# Patient Record
Sex: Female | Born: 1965
Health system: Southern US, Community
[De-identification: ages and names within clinical notes are randomized; demographics above are authoritative.]

## PROBLEM LIST (undated history)

## (undated) DIAGNOSIS — O021 Missed abortion: Secondary | ICD-10-CM

## (undated) DIAGNOSIS — K219 Gastro-esophageal reflux disease without esophagitis: Secondary | ICD-10-CM

## (undated) DIAGNOSIS — N3010 Interstitial cystitis (chronic) without hematuria: Secondary | ICD-10-CM

## (undated) DIAGNOSIS — M1990 Unspecified osteoarthritis, unspecified site: Secondary | ICD-10-CM

## (undated) HISTORY — PX: TUBAL LIGATION: SHX77

## (undated) HISTORY — PX: DILATION AND CURETTAGE OF UTERUS: SHX78

## (undated) HISTORY — PX: OTHER SURGICAL HISTORY: SHX169

## (undated) HISTORY — PX: NECK SURGERY: SHX720

## (undated) HISTORY — PX: COLONOSCOPY: SHX174

## (undated) HISTORY — PX: DIAGNOSTIC LAPAROSCOPY: SUR761

## (undated) HISTORY — PX: ABDOMINAL HYSTERECTOMY: SHX81

---

## 1998-03-02 ENCOUNTER — Encounter: Admission: RE | Admit: 1998-03-02 | Discharge: 1998-05-31 | Payer: Self-pay | Admitting: *Deleted

## 1998-03-09 ENCOUNTER — Other Ambulatory Visit: Admission: RE | Admit: 1998-03-09 | Discharge: 1998-03-09 | Payer: Self-pay | Admitting: Obstetrics and Gynecology

## 1998-07-04 ENCOUNTER — Inpatient Hospital Stay (HOSPITAL_COMMUNITY): Admission: AD | Admit: 1998-07-04 | Discharge: 1998-07-04 | Payer: Self-pay | Admitting: *Deleted

## 1998-07-10 ENCOUNTER — Inpatient Hospital Stay (HOSPITAL_COMMUNITY): Admission: AD | Admit: 1998-07-10 | Discharge: 1998-07-10 | Payer: Self-pay | Admitting: Obstetrics and Gynecology

## 1998-10-02 ENCOUNTER — Inpatient Hospital Stay (HOSPITAL_COMMUNITY): Admission: AD | Admit: 1998-10-02 | Discharge: 1998-10-04 | Payer: Self-pay | Admitting: Obstetrics and Gynecology

## 1998-11-17 ENCOUNTER — Other Ambulatory Visit: Admission: RE | Admit: 1998-11-17 | Discharge: 1998-11-17 | Payer: Self-pay | Admitting: Obstetrics and Gynecology

## 1999-05-31 ENCOUNTER — Other Ambulatory Visit: Admission: RE | Admit: 1999-05-31 | Discharge: 1999-05-31 | Payer: Self-pay | Admitting: Obstetrics and Gynecology

## 2001-01-07 ENCOUNTER — Encounter: Payer: Self-pay | Admitting: Obstetrics and Gynecology

## 2001-01-07 ENCOUNTER — Encounter: Admission: RE | Admit: 2001-01-07 | Discharge: 2001-01-07 | Payer: Self-pay | Admitting: Obstetrics and Gynecology

## 2001-03-15 ENCOUNTER — Ambulatory Visit (HOSPITAL_COMMUNITY): Admission: RE | Admit: 2001-03-15 | Discharge: 2001-03-15 | Payer: Self-pay | Admitting: Obstetrics and Gynecology

## 2001-03-21 ENCOUNTER — Emergency Department (HOSPITAL_COMMUNITY): Admission: EM | Admit: 2001-03-21 | Discharge: 2001-03-21 | Payer: Self-pay | Admitting: Emergency Medicine

## 2001-03-21 ENCOUNTER — Encounter: Payer: Self-pay | Admitting: Emergency Medicine

## 2001-04-07 ENCOUNTER — Ambulatory Visit (HOSPITAL_COMMUNITY): Admission: RE | Admit: 2001-04-07 | Discharge: 2001-04-07 | Payer: Self-pay | Admitting: *Deleted

## 2001-05-10 ENCOUNTER — Encounter: Payer: Self-pay | Admitting: Internal Medicine

## 2001-05-10 ENCOUNTER — Encounter: Admission: RE | Admit: 2001-05-10 | Discharge: 2001-05-10 | Payer: Self-pay | Admitting: Internal Medicine

## 2001-07-13 ENCOUNTER — Encounter: Admission: RE | Admit: 2001-07-13 | Discharge: 2001-07-13 | Payer: Self-pay | Admitting: Neurosurgery

## 2001-07-13 ENCOUNTER — Encounter: Payer: Self-pay | Admitting: Neurosurgery

## 2001-08-08 ENCOUNTER — Ambulatory Visit (HOSPITAL_COMMUNITY): Admission: RE | Admit: 2001-08-08 | Discharge: 2001-08-09 | Payer: Self-pay | Admitting: Neurosurgery

## 2001-08-08 ENCOUNTER — Encounter: Payer: Self-pay | Admitting: Neurosurgery

## 2001-09-09 ENCOUNTER — Encounter: Payer: Self-pay | Admitting: Neurosurgery

## 2001-09-09 ENCOUNTER — Ambulatory Visit (HOSPITAL_COMMUNITY): Admission: RE | Admit: 2001-09-09 | Discharge: 2001-09-09 | Payer: Self-pay | Admitting: Neurosurgery

## 2001-12-17 ENCOUNTER — Encounter: Admission: RE | Admit: 2001-12-17 | Discharge: 2001-12-17 | Payer: Self-pay | Admitting: Neurosurgery

## 2001-12-17 ENCOUNTER — Encounter: Payer: Self-pay | Admitting: Neurosurgery

## 2002-04-07 ENCOUNTER — Encounter: Admission: RE | Admit: 2002-04-07 | Discharge: 2002-04-07 | Payer: Self-pay | Admitting: Urology

## 2002-04-07 ENCOUNTER — Encounter: Payer: Self-pay | Admitting: Urology

## 2002-04-22 ENCOUNTER — Encounter (INDEPENDENT_AMBULATORY_CARE_PROVIDER_SITE_OTHER): Payer: Self-pay

## 2002-04-22 ENCOUNTER — Ambulatory Visit (HOSPITAL_COMMUNITY): Admission: RE | Admit: 2002-04-22 | Discharge: 2002-04-22 | Payer: Self-pay | Admitting: Obstetrics and Gynecology

## 2002-04-29 ENCOUNTER — Encounter: Payer: Self-pay | Admitting: Gastroenterology

## 2002-04-29 ENCOUNTER — Encounter: Admission: RE | Admit: 2002-04-29 | Discharge: 2002-04-29 | Payer: Self-pay | Admitting: Gastroenterology

## 2003-07-31 HISTORY — PX: OVARIAN CYST REMOVAL: SHX89

## 2004-06-08 ENCOUNTER — Encounter: Admission: RE | Admit: 2004-06-08 | Discharge: 2004-06-08 | Payer: Self-pay | Admitting: Neurosurgery

## 2004-06-16 ENCOUNTER — Encounter: Admission: RE | Admit: 2004-06-16 | Discharge: 2004-06-16 | Payer: Self-pay | Admitting: Neurosurgery

## 2004-10-17 ENCOUNTER — Ambulatory Visit (HOSPITAL_COMMUNITY): Admission: RE | Admit: 2004-10-17 | Discharge: 2004-10-17 | Payer: Self-pay | Admitting: Neurology

## 2004-11-02 ENCOUNTER — Encounter: Admission: RE | Admit: 2004-11-02 | Discharge: 2004-11-02 | Payer: Self-pay | Admitting: Neurology

## 2005-05-14 ENCOUNTER — Ambulatory Visit (HOSPITAL_BASED_OUTPATIENT_CLINIC_OR_DEPARTMENT_OTHER): Admission: RE | Admit: 2005-05-14 | Discharge: 2005-05-14 | Payer: Self-pay | Admitting: Urology

## 2005-05-14 ENCOUNTER — Ambulatory Visit (HOSPITAL_COMMUNITY): Admission: RE | Admit: 2005-05-14 | Discharge: 2005-05-14 | Payer: Self-pay | Admitting: Urology

## 2006-12-10 ENCOUNTER — Encounter: Admission: RE | Admit: 2006-12-10 | Discharge: 2006-12-10 | Payer: Self-pay | Admitting: Neurosurgery

## 2006-12-13 ENCOUNTER — Encounter: Admission: RE | Admit: 2006-12-13 | Discharge: 2006-12-13 | Payer: Self-pay | Admitting: Neurosurgery

## 2007-05-09 ENCOUNTER — Encounter: Admission: RE | Admit: 2007-05-09 | Discharge: 2007-05-09 | Payer: Self-pay | Admitting: Obstetrics and Gynecology

## 2007-10-07 ENCOUNTER — Ambulatory Visit (HOSPITAL_BASED_OUTPATIENT_CLINIC_OR_DEPARTMENT_OTHER): Admission: RE | Admit: 2007-10-07 | Discharge: 2007-10-07 | Payer: Self-pay | Admitting: Urology

## 2007-10-21 ENCOUNTER — Ambulatory Visit (HOSPITAL_BASED_OUTPATIENT_CLINIC_OR_DEPARTMENT_OTHER): Admission: RE | Admit: 2007-10-21 | Discharge: 2007-10-21 | Payer: Self-pay | Admitting: Urology

## 2008-05-10 ENCOUNTER — Encounter: Admission: RE | Admit: 2008-05-10 | Discharge: 2008-05-10 | Payer: Self-pay | Admitting: Obstetrics and Gynecology

## 2008-12-09 ENCOUNTER — Ambulatory Visit (HOSPITAL_COMMUNITY): Admission: RE | Admit: 2008-12-09 | Discharge: 2008-12-09 | Payer: Self-pay | Admitting: Internal Medicine

## 2009-05-11 ENCOUNTER — Encounter: Admission: RE | Admit: 2009-05-11 | Discharge: 2009-05-11 | Payer: Self-pay | Admitting: Obstetrics and Gynecology

## 2009-07-30 HISTORY — PX: OTHER SURGICAL HISTORY: SHX169

## 2010-05-24 ENCOUNTER — Encounter: Admission: RE | Admit: 2010-05-24 | Discharge: 2010-05-24 | Payer: Self-pay | Admitting: Obstetrics and Gynecology

## 2010-09-18 ENCOUNTER — Other Ambulatory Visit: Payer: Self-pay

## 2010-09-18 DIAGNOSIS — R102 Pelvic and perineal pain: Secondary | ICD-10-CM

## 2010-09-20 ENCOUNTER — Ambulatory Visit
Admission: RE | Admit: 2010-09-20 | Discharge: 2010-09-20 | Disposition: A | Discharge status: Home / Self Care | Payer: BC Managed Care – PPO | Source: Ambulatory Visit

## 2010-09-20 DIAGNOSIS — R102 Pelvic and perineal pain: Secondary | ICD-10-CM

## 2010-10-23 ENCOUNTER — Other Ambulatory Visit: Payer: Self-pay

## 2010-10-23 ENCOUNTER — Other Ambulatory Visit: Payer: Self-pay | Admitting: *Deleted

## 2010-10-23 DIAGNOSIS — N83209 Unspecified ovarian cyst, unspecified side: Secondary | ICD-10-CM

## 2010-10-23 DIAGNOSIS — R339 Retention of urine, unspecified: Secondary | ICD-10-CM

## 2010-10-25 ENCOUNTER — Other Ambulatory Visit: Payer: BC Managed Care – PPO

## 2010-10-26 ENCOUNTER — Ambulatory Visit
Admission: RE | Admit: 2010-10-26 | Discharge: 2010-10-26 | Disposition: A | Discharge status: Home / Self Care | Payer: BC Managed Care – PPO | Source: Ambulatory Visit | Attending: *Deleted | Admitting: *Deleted

## 2010-10-26 ENCOUNTER — Ambulatory Visit
Admission: RE | Admit: 2010-10-26 | Discharge: 2010-10-26 | Disposition: A | Discharge status: Home / Self Care | Payer: BC Managed Care – PPO | Source: Ambulatory Visit

## 2010-10-26 DIAGNOSIS — N83209 Unspecified ovarian cyst, unspecified side: Secondary | ICD-10-CM

## 2010-10-26 DIAGNOSIS — R339 Retention of urine, unspecified: Secondary | ICD-10-CM

## 2010-12-12 NOTE — Op Note (Signed)
NAMEMarland Kitchen  Katie Rice, Katie Rice NO.:  [id number]   MEDICAL RECORD NO.:  [id number]          PATIENT TYPE:  AMB   LOCATION:  N[facility]C                         FACILITY:  [facility]   PHYSICIAN:  Jamison Neighbor, M.D.  DATE OF BIRTH:  [birth year]   DATE OF PROCEDURE:  10/21/2007  DATE OF DISCHARGE:                               OPERATIVE REPORT   SERVICE:  Urology.   PREOPERATIVE DIAGNOSIS:  Urgency incontinence.   POSTOPERATIVE DIAGNOSIS:  Urgency incontinence.   PROCEDURE:  Second stage InterStim implantation.   SURGEON:  Dr. Marcelyn Bruins.   ANESTHESIA:  Local plus IV sedation.   COMPLICATIONS:  None.   DRAINS:  None.   BRIEF HISTORY:  This 45 year old female had an outstanding response to  her first stage InterStim implantation.  She has had greater than 50%  decrease in her urgency and frequency episodes and complete control of  any urge incontinence.  The patient is now to undergo second stage  InterStim implantation.  She understands the risks and benefits of the  procedure and in particular is aware of the potential risk for future  revisions of the device or for infection. Full informed consent was  obtained.   PROCEDURE:  After successful induction of adequate IV sedation, the  patient was placed in the prone position, prepped with Betadine and  draped in the usual sterile fashion.  A Vi-Drape was placed over the  operative site.  The local anesthetic was infiltrated into the skin and  the previous incision was opened and then lengthened medially. The  connection between the previously placed quadripolar lead and the  external lead was identified. The protective bootie was cut away and the  external lead was discarded.  The previously placed quadripolar lead was  then attached to a new IPG tube device. A pocket was created and was  irrigated with antibiotic solution.  The IPG was placed down within the  pocket with the writing side up.  Impedance testing was done  and  everything appeared to be working normally.  The incision was then  closed with 2-0 Vicryl and 3-0 nylon sutures.  The patient tolerated the  procedure well and was taken to the recovery room in good condition.  She will return in one week's time for suture removal and reevaluation.     Jamison Neighbor, M.D.  Electronically Signed    RJE/MEDQ  D:  10/21/2007  T:  10/21/2007  Job:  [id number]

## 2010-12-12 NOTE — Op Note (Signed)
NAME:  Katie Rice, Katie Rice NO.:  [id number]   MEDICAL RECORD NO.:  [id number]          PATIENT TYPE:  AMB   LOCATION:  NESC                         FACILITY:  [facility]   PHYSICIAN:  Jamison Neighbor, M.D.  DATE OF BIRTH:  10/01/1965   DATE OF PROCEDURE:  10/07/2007  DATE OF DISCHARGE:                               OPERATIVE REPORT   SERVICE:  Urology.   PREOPERATIVE DIAGNOSES:  Urgency, frequency syndrome with associated  history of sphincter dyssynergia.   SECONDARY DIAGNOSIS:  Intermittent urinary retention.   POSTOPERATIVE DIAGNOSES:  Urgency, frequency syndrome with associated  history of sphincter dyssynergia, intermittent urinary retention.   PROCEDURE:  First stage InterStim implantation.   SURGEON:  Jamison Neighbor, M.D.   ANESTHESIA:  Was local plus IV sedation.   COMPLICATIONS:  None.   DRAINS:  None.   BRIEF HISTORY:  This 45 year old female has had problems with inability  to relax her pelvic floor and associated external sphincter dyssynergia.  The patient has had a difficult time emptying her bladder.  This is due  at least in part to the chronic pain she has associated with  interstitial cystitis.   The patient's voiding diary shows that she does not have an excessive  number of voids per day but has episodes of incomplete emptying.  She is  now to undergo InterStim first stage testing to determine if the  InterStim implant will help with her voiding dysfunction.  She  understands the risks and benefits of the procedure and gave full  informed consent.   PROCEDURE IN DETAIL:  After adequate successful IV sedation the patient  was placed in the prone position.  The buttocks were pulled apart with  tape in order to expose the rectal sphincter.  The entire area was  prepped for 10 minutes with scrub and paint and then covered with Vi-  Drape.  Fluoroscopy was used to identify the level of the third sacral  foramen and a search needle was placed  in the third foramen on each  side.  Stimulation showed dorsiflexion of the great toe on both the  right and left side and a reasonable bellows affect bilaterally but much  better on the left than on the right.  Passing the needle one level  higher showed rotation of the foot indicating that this clearly was the  S3 foramen.  The search needle was then left in place and a guidewire  was passed through it and the needle was removed.  A small incision was  made and the dilating trocar was passed down into the third sacral  foramen.  The quadripolar lead was then passed down and positioned at  appropriate level.  There was excellent stimulation of leads I, II and  III.  A small incision was made in the upper aspect of the left buttock  and the tunneling tool was used to take the quadripolar lead over to  this small opening and at this location it was connected to the lead  extender which was then tunneled out to the right-hand side.  The small  pocket was irrigated and the connecter was then placed down within this  pocket.  The incision was closed with a Vicryl plus 3-0 nylon sutures.  The patient tolerated the procedure and was taken to the recovery room  in good condition.      Jamison Neighbor, M.D.  Electronically Signed     RJE/MEDQ  D:  10/07/2007  T:  10/08/2007  Job:  [id number]

## 2010-12-15 NOTE — Op Note (Signed)
NAME:  Katie Rice, Katie Rice NO.:  [id number]   MEDICAL RECORD NO.:  [id number]          PATIENT TYPE:  AMB   LOCATION:  NESC                         FACILITY:  [facility]   PHYSICIAN:  Valetta Fuller, M.D.  DATE OF BIRTH:  [birth year]   DATE OF PROCEDURE:  DATE OF DISCHARGE:                                 OPERATIVE REPORT   PREOPERATIVE DIAGNOSIS:  Chronic pelvic pain with possible painful bladder  syndrome/interstitial cystitis.   POSTOPERATIVE DIAGNOSIS:  Chronic pelvic pain with possible painful bladder  syndrome/interstitial cystitis.   PROCEDURE PERFORMED:  Cystoscopy, hydraulic over distention of the bladder  with instillation.   SURGEON:  Valetta Fuller, M.D.   ANESTHESIA:  General.   INDICATIONS:  [name] is a 45 year old female. She has had some recurrent  urinary tract infections in the past. She has had problems more recently  with some chronic pelvic discomfort. The patient has had evaluation and  treatment for ovarian cystic disease in the past. More recently she has had  progressive pelvic complaints. She has not had a lot of problems with  frequency and nocturia although that has increased somewhat recently. She  does have a constant pressure sensation in her pelvis with what appears to  be intermittent bladder spasms. She also has painful intercourse and has had  a tender bladder base on clinical exam. She presents now for cystoscopic  evaluation of her bladder to further assess for the possibility of painful  bladder syndrome for both diagnostic and therapeutic purposes.   TECHNIQUE AND FINDINGS:  The patient was brought to the operating room where  she had successful induction of general anesthesia. She was placed in  lithotomy position, prepped and draped in the usual manner. Initial  cystoscopy was totally unremarkable with a normal-appearing bladder. The  patient underwent hydraulic over distention to 100 cmH2O pressure for 5  minutes.  Capacity was extremely normal at about 1500 mL capacity. The  patient did have some mild diffuse glomerular hemorrhaging but it was not  terribly impressive and I would grade this as 1:2. A repeat hydraulic over  distention for 2 minutes was performed. We then instilled some Marcaine in  her bladder. She is brought to the recovery room in stable condition.           ______________________________  Valetta Fuller, M.D.     DSG/MEDQ  D:  05/14/2005  T:  05/14/2005  Job:  [id number]

## 2010-12-15 NOTE — Op Note (Signed)
E[place]    [facility] [place]                                  MRN:  [id number]           Operative Report                                  ATT:  Lenoard Aden, M.D.  Procedure:  03/15/01             DICT: PREOPERATIVE DIAGNOSIS:       Desire for elective sterilization.  POSTOPERATIVE DIAGNOSIS:      Desire for elective sterilization.  PROCEDURE:                    Laparoscopic tubal sterilization.  SURGEON:                      Lenoard Aden, M.D.  ASSISTANT:                    Gretta Cool., M.D.  ANESTHESIA:                   General.  ESTIMATED BLOOD LOSS:         Less than 50 cc.  COMPLICATIONS:                None.  DRAINS:                       None.  COUNTS:                       Correct.  CONDITION:                    The patient recovered in good condition.  FINDINGS:                     Normal liver, gallbladder, normal appendiceal                               area, normal tubes and ovaries, normal sized                               uterus, no anterior/posterior cul-de-sac.  BRIEF OPERATIVE NOTE:         After being apprised of the risks of anesthesia, infection, bleeding, uterine perforation, injury to abdominal organs, the need for repair, the patient went to the operating room where she was administered general anesthesia without complications, prepped and draped in the usual sterile fashion and catheterized until the bladder was empty.  After achieving adequate anesthesia, examined under anesthesia revealed a moderate sized uterus and no adnexal masses.  At this time, Hulka tenaculum placed per vagina.  Infraumbilical incision made with a scalpel after placement of a diluted Marcaine solution.  A Veress needle was placed with an opening pressure -2 noted. The patients pressure set to 25/5 L CO2 insufflated without difficulty.  Trocar placement 10 mm placed atraumatically and pictures taken  -- pictures of normal liver, gallbladder, appendiceal area taken, otherwise, normal structures as previously noted.  Kleppinger bipolar  cautery then entered through the trocar through the operative scope after the pressure has been reset to 15.  At this time, the right tube is traced out to the fimbriated end, grasped and elevate the isthmic section of the tube and burned down using bipolar cautery down to a resistance of 0 in three contiguous segments.  The same procedure was done after tracing out the left tube on the left tube.  Both tubes are divided using hook scissors, good separation is noted. Both tubal lumens are visualized.  At this time, good hemostasis is noted.  CO2 is released, upon releasing CO2, good hemostasis was confirmed, trocar and scope were removed under direct visualization.  Incision is closed using a 0 and a 4-0 Vicryl.  Instruments removed from the vagina. The patient tolerated the procedure well, was transferred to the recovery room in good condition. DD:  03/15/01 TD:  03/16/01 Job: [id number]

## 2010-12-15 NOTE — Op Note (Signed)
[place]  Patient:    Katie Rice, KINDEL Visit Number: [id number] MRN: [id number]          Service Type: Attending:  Garlon Hatchet., M.D. Dictated by:   Garlon Hatchet., M.D. Proc. Date: 08/08/01                             Operative Report  PREOPERATIVE DIAGNOSIS:  Cervical spondylosis and ruptured disk at cervical 5-6 with bilateral C6 radiculopathy.  POSTOPERATIVE DIAGNOSIS:   OPERATION PERFORMED:   Anterior cervical diskectomy and fusion at cervical 5-6 with patellar wedge, 6 mm, and Zephyr plate.  SURGEON:  Garlon Hatchet., M.D.  ASSISTANT:  Reinaldo Meeker, M.D.  ANESTHESIA:  General endotracheal.  HISTORY OF PRESENT ILLNESS:  Patient is a very pleasant 45 year old female who has had longstanding neck and bilateral arm pain radiating down the C6 distribution to her thumb and forefinger.  She had failed conservative treatment with time, anti-inflammatories and physical therapy.  MRI and repeat MRI showed central disk rupture compressing on the spinal cord and both C6 nerve roots.  Patient was sent for counselling who recommended the risks and benefits of anterior cervical diskectomy and fusion; and, she decided to proceed forward.  DESCRIPTION OF PROCEDURE:  Patient was brought to the OR and induced with general anesthesia.  Patient was supine, neck flexed and extended with five pounds of Halter traction.  The right side of her neck was prepped and draped in the usual sterile fashion.  Preoperative x-ray localized the needle over the C5-6 disk space.  A curvilinear incision was made just off the midline to the anterior b border of the sternocleidomastoid.  At this level a superficial  level of the platysma was dissected out and divided longitudinally.  The avascular plane between the sternocleidomastoid and the omohyoid was brought down to the prevertebral fascia.  Prevertebral fascia was dissected with the Kitners.  Intraoperative  x-rays confirmed localization at the C5-6 disk space.  The _________  with an 11 blade scalpel and the remainder of the longus colli was reflected laterally.  A self-retaining retractor was placed.  Using  2 and 3 mm Kerrison punches there was a small anterior osteophyte coming off the C5 vertebral body.  This was removed and the _____ of the ventral annulus and nucleus pulposus was drilled down with a high-speed drill to the posterior longitudinal ligament.  Operating microscope was draped and brought onto the field.  Under microscopic illumination the _____________ was removed in piecemeal fashion with 1 and 2 mm Kerrison punches.  The thecal sac was visualized.  There was a small central disk ruptured that was removed that was compressing on the thecal sac, and both proximal ends of the C6 nerve root were decompressed out their foramen.  At the end of the diskectomy the entire thecal sac and both C6 nerve roots were well decompressed and scored with an angled nerve hook.  The endplates were then prepared for arthrodesis.  Patellar wedge was inserted after the interbody strut has been placed.  Then a Zephyr plate was sized, selected and four 13 mm screws were inserted, set screwed and tightened.  Postop x-ray confirmed good localization of the bone graft, plate and screws.  The wound was copiously irrigated.  Meticulous hemostasis was maintained.  Platysma was closed with 3-0 interrupted Vicryl and the skin was running 4-0 subcuticular.  Benzoin and Steri-Strips were applied.  Patient  was taken to the recovery room in stable condition.  No complications occurred. Dictated by:   Garlon Hatchet., M.D. Attending:  Donalee Citrin, Montez Hageman., M.D. DD:  08/08/01 TD:  08/09/01 Job: [id number]

## 2010-12-15 NOTE — Op Note (Signed)
NAME:  Katie Rice, Katie Rice                          ACCOUNT NO.:  [id number]   MEDICAL RECORD NO.:  [id number]                   PATIENT TYPE:  AMB   LOCATION:  SDC                                  FACILITY:  [facility]   PHYSICIAN:  Lenoard Aden, M.D.             DATE OF BIRTH:  11-29-1965   DATE OF PROCEDURE:  04/22/2002  DATE OF DISCHARGE:                                 OPERATIVE REPORT   CHIEF COMPLAINT:  1. Subacute pelvic pain.  2. Right ovarian cyst.   POSTOPERATIVE DIAGNOSES:  1. Subacute pelvic pain.  2. Right ovarian cyst.   PROCEDURE:  1. Diagnostic laparoscopy.  2. Right ovarian cystectomy.   SURGEON:  Lenoard Aden, M.D.   ANESTHESIA:  General.   ESTIMATED BLOOD LOSS:  Less than 50 cc.   COMPLICATIONS:  None.   COUNTS:  Correct.   SPECIMENS:  Right ovarian cyst wall.   DISPOSITION:  The patient to recovery in good condition.   BRIEF OPERATIVE NOTE:  After being apprised of the risks of anesthesia,  infection, bleeding, injury to abdominal organs with need for repair,  delayed versus immediate complications to include bowel and bladder injury,  inability to cure pelvic pain, the patient is brought to the operating room  where she is administered a general anesthetic without complications,  prepped and draped in the usual sterile fashion, catheterized until the  bladder is emptied.  Examination under anesthesia reveals a mid positioned  uterus and no adnexal masses.  Hulka tenaculum placed per vagina.  Infraumbilical incision made with the scalpel after placement of a dilute  Marcaine solution 5 cc.  After achieving infiltration with Marcaine, scalpel  incision/stab incision is made.  Veress needle placed.  Opening pressure -2  made.  The patient pressure set to 25.  Then 4 L CO2 insufflated without  difficulty.  The 10 mm trocar placed atraumatically.  Pictures taken.  Please note that prior to placement of the trocar hanging drop test was  noted to be  negative upon placement of the Veress needle.  At this time  after atraumatic trocar entry visualization reveals normal liver,  gallbladder area.  Appendix is not visualized.  No evidence of adhesions.  Bilateral tubal interruption noted.  Normal sized uterus and a small left  ovary and an enlarged right ovary consistent with previously noted ovarian  cyst.  At this time two 5 mm ports are made suprapubically under direct  visualization after making incision with the scalpel and 5 mm trocar sites  are placed.  Atraumatic grasper is used to grasp the ovarian ligament.  Ovary is rotated and area of cyst wall is cauterized with Kleppinger bipolar  cautery and opened.  Serosanguineous type material noted from the  hemorrhagic cyst cavity.  The cavity is then grasped using an atraumatic  grasper and teased from the ovarian cyst wall, is removed intact and sent to  pathology.  The base of the ovarian cyst wall bed is then cauterized using  Kleppinger bipolar cautery and cauterizing the edges of the cyst wall as  well.  Irrigation is accomplished.  60 cc placed and aspirated from the cul-  de-sac.  Good hemostasis was noted.  Right ovarian tubo-ovarian round  ligament appears within normal limits.  Both tubes are previously noted had  been surgically divided.  At this time 5 mm trocar is removed under direct  visualization.  CO2 is released.  Incision is closed using 0 Vicryl and  Dermabond.  The patient is awakened and transferred to recovery in good  condition after removal of Hulka tenaculum from vagina.                                               Lenoard Aden, M.D.    RJT/MEDQ  D:  04/22/2002  T:  04/22/2002  Job:  [id number]   cc:   [facility]

## 2010-12-15 NOTE — H&P (Signed)
[facility]  Patient:    Katie Rice, Katie Rice                         MRN: [id number] Adm. Date:  03/15/01 Attending:  Lenoard Aden, M.D.                         History and Physical  CHIEF COMPLAINT:              Desire for elective sterilization.  HISTORY OF PRESENT ILLNESS:   The patient is a 45 year old white female, G6, P3, status post vaginal delivery in 2000, who presents for elective tubal sterilization.  PAST MEDICAL HISTORY:         1. Vaginal delivery x 3.                               2. Spontaneous miscarriage x 3.                               3. LEEP x 1.                               4. History of spinal and neck injury without                                  surgery.                               5. History of sinus surgery in 1994.                               6. Colonoscopy in 1995.  FAMILY HISTORY:               Hypothyroidism, cerebrovascular disease, and hypertension.  ALLERGIES:                    DOXYCYCLINE.  LABORATORY DATA:              Blood type A positive.  MEDICATIONS:                  None.  PHYSICAL EXAMINATION:  GENERAL:                      Well-developed, well-nourished white female in no apparent distress.  HEENT:                        Normal.  LUNGS:                        Clear.  HEART:                        Regular rate and rhythm.  ABDOMEN:                      Soft, nontender.  PELVIC:                       Small  anteflexed uterus.  No adnexal mass.  IMPRESSION:                   Desire for elective sterilization.  PLAN:                         Proceed with elective tubal sterilization.  The risks of anesthesia, infection, bleeding, injury to abdominal organs with need for repair was discussed.  Delayed versus immediate complications to include bowel or bladder injury were noted, with need for repair.  Failure risk of tubal ligation of 5-10 per 1000 noted.  The patient acknowledges and  desires to proceed. DD:  03/15/01 TD:  03/15/01 Job: [id number]

## 2010-12-15 NOTE — H&P (Signed)
   NAME:  Katie Rice, Katie Rice                          ACCOUNT NO.:  [id number]   MEDICAL RECORD NO.:  [id number]                   PATIENT TYPE:  AMB   LOCATION:  [facility]C                                  FACILITY:  [facility]   PHYSICIAN:  Lenoard Aden, M.D.             DATE OF BIRTH:  [birth year]   DATE OF ADMISSION:  04/22/2002  DATE OF DISCHARGE:                                HISTORY & PHYSICAL   CHIEF COMPLAINT:  Left lower quadrant pain.   HISTORY OF PRESENT ILLNESS:  The patient is a 45 year old white female, G6,  P3 status post tubal ligation in 2002 who presents with a subacute history  less than one month of left lower quadrant pain.  She has been seen by  urology with a negative work-up.  Due to gastrointestinal symptoms she  underwent colonoscopy which was negative.  She has a known small 3 cm  hemorrhagic cyst on her right ovary which is the only positive finding with  pain requiring Vicodin for discomfort.  She presents now for definitive  evaluation.   PAST MEDICAL HISTORY:  1. Remarkable for colonoscopy.  2  Vaginal delivery x3.  1. Spontaneous miscarriage x3.  2. Loop electrosurgical excision procedure x1.  3. History of spinal and neck injury without surgery.  4. History of sinus surgery in 1994.   FAMILY HISTORY:  Hyperthyroidism, cerebrovascular disease, hypertension.   ALLERGIES:  DOXYCYCLINE.   BLOOD TYPE:  A positive.   MEDICATIONS:  None.   PAST SURGICAL HISTORY:  Past surgical includes postpartum bilateral tubal  ligation.   PHYSICAL EXAMINATION:  GENERAL:  She is a well-developed, well-nourished  white female in no apparent distress.  HEENT:  Normal.  LUNGS:  Clear.  CARDIOVASCULAR:  Regular rate and rhythm.  ABDOMEN:  Soft. No rebound or guarding.  PELVIC:  Exam reveals a small anteflexed uterus.  No adnexal masses.  EXTREMITIES:  No cords.  NEUROLOGICAL:  Nonfocal.   IMPRESSION:  1. Pelvic pain of unknown etiology.  2. Negative GI and GU  workup.   PLAN:  Proceed with diagnostic laparoscopy, possible ovarian cystectomy.  The risks of anesthesia, infection, bleeding, injury to abdominal organs,  need for repair, delayed or immediate complications with need for repair  discussed, inability to cure pain explained as well.  The patient  acknowledges and wishes to proceed.                                               Lenoard Aden, M.D.    RJT/MEDQ  D:  04/22/2002  T:  04/22/2002  Job:  [id number]

## 2011-04-17 ENCOUNTER — Other Ambulatory Visit: Payer: Self-pay | Admitting: Obstetrics and Gynecology

## 2011-04-17 DIAGNOSIS — Z1231 Encounter for screening mammogram for malignant neoplasm of breast: Secondary | ICD-10-CM

## 2011-04-23 LAB — POCT HEMOGLOBIN-HEMACUE: Operator id: 268271

## 2011-05-24 ENCOUNTER — Other Ambulatory Visit: Payer: Self-pay | Admitting: Gastroenterology

## 2011-05-24 ENCOUNTER — Ambulatory Visit
Admission: RE | Admit: 2011-05-24 | Discharge: 2011-05-24 | Disposition: A | Discharge status: Home / Self Care | Payer: BC Managed Care – PPO | Source: Ambulatory Visit | Attending: Gastroenterology | Admitting: Gastroenterology

## 2011-05-24 DIAGNOSIS — R1013 Epigastric pain: Secondary | ICD-10-CM

## 2011-05-29 ENCOUNTER — Ambulatory Visit
Admission: RE | Admit: 2011-05-29 | Discharge: 2011-05-29 | Disposition: A | Discharge status: Home / Self Care | Payer: BC Managed Care – PPO | Source: Ambulatory Visit | Attending: Obstetrics and Gynecology | Admitting: Obstetrics and Gynecology

## 2011-05-29 DIAGNOSIS — Z1231 Encounter for screening mammogram for malignant neoplasm of breast: Secondary | ICD-10-CM

## 2011-06-19 ENCOUNTER — Other Ambulatory Visit: Payer: Self-pay | Admitting: Gastroenterology

## 2011-06-28 ENCOUNTER — Other Ambulatory Visit (HOSPITAL_COMMUNITY): Payer: Self-pay | Admitting: Gastroenterology

## 2011-06-28 DIAGNOSIS — R1013 Epigastric pain: Secondary | ICD-10-CM

## 2011-07-09 ENCOUNTER — Ambulatory Visit (HOSPITAL_COMMUNITY)
Admission: RE | Admit: 2011-07-09 | Discharge: 2011-07-09 | Disposition: A | Discharge status: Home / Self Care | Payer: BC Managed Care – PPO | Source: Ambulatory Visit | Attending: Gastroenterology | Admitting: Gastroenterology

## 2011-07-09 DIAGNOSIS — R1013 Epigastric pain: Secondary | ICD-10-CM | POA: Insufficient documentation

## 2011-07-09 MED ORDER — TECHNETIUM TC 99M SULFUR COLLOID
2.0000 | Freq: Once | INTRAVENOUS | Status: AC | PRN
  Administered 2011-07-09: 2 via ORAL

## 2012-03-30 HISTORY — PX: OTHER SURGICAL HISTORY: SHX169

## 2012-04-08 ENCOUNTER — Other Ambulatory Visit: Payer: Self-pay | Admitting: Neurosurgery

## 2012-04-08 DIAGNOSIS — M545 Low back pain: Secondary | ICD-10-CM

## 2012-04-09 ENCOUNTER — Ambulatory Visit
Admission: RE | Admit: 2012-04-09 | Discharge: 2012-04-09 | Disposition: A | Discharge status: Home / Self Care | Payer: BC Managed Care – PPO | Source: Ambulatory Visit | Attending: Neurosurgery | Admitting: Neurosurgery

## 2012-04-09 DIAGNOSIS — M545 Low back pain: Secondary | ICD-10-CM

## 2012-04-10 ENCOUNTER — Other Ambulatory Visit: Payer: BC Managed Care – PPO

## 2012-04-21 ENCOUNTER — Other Ambulatory Visit: Payer: Self-pay | Admitting: Obstetrics and Gynecology

## 2012-04-21 DIAGNOSIS — Z1231 Encounter for screening mammogram for malignant neoplasm of breast: Secondary | ICD-10-CM

## 2012-04-28 ENCOUNTER — Encounter (HOSPITAL_COMMUNITY): Payer: Self-pay | Admitting: Pharmacist

## 2012-05-06 ENCOUNTER — Encounter (HOSPITAL_COMMUNITY)
Admission: RE | Admit: 2012-05-06 | Discharge: 2012-05-06 | Disposition: A | Discharge status: Home / Self Care | Payer: BC Managed Care – PPO | Source: Ambulatory Visit | Attending: Obstetrics and Gynecology | Admitting: Obstetrics and Gynecology

## 2012-05-06 ENCOUNTER — Encounter (HOSPITAL_COMMUNITY): Payer: Self-pay

## 2012-05-06 ENCOUNTER — Other Ambulatory Visit: Payer: Self-pay | Admitting: Obstetrics and Gynecology

## 2012-05-06 HISTORY — DX: Missed abortion: O02.1

## 2012-05-06 LAB — CBC
HCT: 40.6 % (ref 36.0–46.0)
Hemoglobin: 13.6 g/dL (ref 12.0–15.0)
MCH: 31.9 pg (ref 26.0–34.0)
RBC: 4.27 MIL/uL (ref 3.87–5.11)

## 2012-05-06 LAB — SURGICAL PCR SCREEN: MRSA, PCR: NEGATIVE

## 2012-05-06 NOTE — Patient Instructions (Addendum)
   Your procedure is scheduled on: Thursday, Oct 17  Enter through the [employer] of [facility] at: 6 am Pick up the phone at the desk and dial [phone] and inform us of your arrival.  Please call this number if you have any problems the morning of surgery: [phone]  Remember: Do not eat food after midnight: Wednesday Do not drink clear liquids after: Wednesday Take these medicines the morning of surgery with a SIP OF WATER: None  Do not wear jewelry, make-up, or FINGER nail polish No metal in your hair or on your body. Do not wear lotions, powders, perfumes. You may wear deodorant.  Please use your CHG wash as directed prior to surgery.  Do not shave anywhere for at least 12 hours prior to first CHG shower.  Do not bring valuables to the hospital. Contacts, dentures or bridgework may not be worn into surgery.  Leave suitcase in the car. After Surgery it may be brought to your room. For patients being admitted to the hospital, checkout time is 11:00am the day of discharge. Home with husband Madelina Sanda cell [phone].  Patients discharged on the day of surgery will not be allowed to drive home.

## 2012-05-14 NOTE — H&P (Signed)
NAMEVALISHA, HESLIN NO.:  [id number]  MEDICAL RECORD NO.:  [id number]  LOCATION:  PERIO                         FACILITY:  [facility]  PHYSICIAN:  Lenoard Aden, M.D.DATE OF BIRTH:  1966/06/16  DATE OF ADMISSION:  04/22/2012 DATE OF DISCHARGE:                             HISTORY & PHYSICAL   CHIEF COMPLAINT:  Pelvic pain, dysmenorrhea, low back pain with negative orthopedic workup, negative neurology work, neurosurgery workup.  She is a 46 year old white female G6, P3 who is status post laparoscopy in 2003 with a history of interstitial cystitis, low back pain, dysmenorrhea, menorrhagia.  She has had a thorough neurologic and orthopedic and neurosurgical workup which does not explained or account any relief for her cyclical dysmenorrhea, low back pain.  She now presents for definitive therapy as recommended per other specialists.  PAST SURGICAL HISTORY:  Remarkable for laparoscopy in 2003, sinus surgery in 1994, tubal ligation, LEEP, and 3 vaginal deliveries.  MEDICATIONS:  Vicodin p.r.n., birth control pills, Benadryl p.r.n., Biotin, vitamin D, B and Elmiron as needed.  FAMILY HISTORY:  Gallbladder disease, diabetes, heart disease, stroke, leukemia, ovarian cancer, hypertension, thyroid disease.  PHYSICAL EXAMINATION:  GENERAL:  She is a well-developed, well- nourished, white female, in no acute distress. HEENT:  Normal. NECK:  Supple.  Full range of motion. LUNGS:  Clear. HEART:  Regular rhythm. ABDOMEN:  Soft, nontender. PELVIC:  Reveals a retroflexed uterus.  No adnexal masses. EXTREMITIES:  There are no cords. NEUROLOGIC:  Nonfocal. SKIN:  Intact.  IMPRESSION:  Persistent cyclical low back pain with dysmenorrhea, menorrhagia, negative workup.  PLAN:  To proceed with a da Vinci-assisted total laparoscopic hysterectomy, bilateral salpingectomy.  Risks of anesthesia, infection, bleeding, injury to abdominal organs, need for repair was  discussed, delayed versus immediate complications to include bowel and bladder injury noted.  The patient acknowledges and wishes to proceed.  Please note it is told that affirmative cure for her pain cannot be promised to be a route of hysterectomy.  She acknowledges to this and wishes to proceed.     Lenoard Aden, M.D.     RJT/MEDQ  D:  05/14/2012  T:  05/14/2012  Job:  [id number]

## 2012-05-15 ENCOUNTER — Encounter (HOSPITAL_COMMUNITY): Payer: Self-pay | Admitting: Anesthesiology

## 2012-05-15 ENCOUNTER — Ambulatory Visit (HOSPITAL_COMMUNITY)
Admission: RE | Admit: 2012-05-15 | Discharge: 2012-05-15 | Disposition: A | Discharge status: Home / Self Care | Payer: BC Managed Care – PPO | Source: Ambulatory Visit | Attending: Obstetrics and Gynecology | Admitting: Obstetrics and Gynecology

## 2012-05-15 ENCOUNTER — Encounter (HOSPITAL_COMMUNITY)
Admission: RE | Disposition: A | Discharge status: Home / Self Care | Payer: Self-pay | Source: Ambulatory Visit | Attending: Obstetrics and Gynecology

## 2012-05-15 ENCOUNTER — Ambulatory Visit (HOSPITAL_COMMUNITY): Payer: BC Managed Care – PPO | Admitting: Anesthesiology

## 2012-05-15 DIAGNOSIS — M545 Low back pain, unspecified: Secondary | ICD-10-CM | POA: Insufficient documentation

## 2012-05-15 DIAGNOSIS — R1032 Left lower quadrant pain: Secondary | ICD-10-CM | POA: Insufficient documentation

## 2012-05-15 DIAGNOSIS — N815 Vaginal enterocele: Secondary | ICD-10-CM | POA: Insufficient documentation

## 2012-05-15 DIAGNOSIS — N949 Unspecified condition associated with female genital organs and menstrual cycle: Secondary | ICD-10-CM | POA: Insufficient documentation

## 2012-05-15 DIAGNOSIS — N946 Dysmenorrhea, unspecified: Secondary | ICD-10-CM | POA: Insufficient documentation

## 2012-05-15 LAB — BASIC METABOLIC PANEL
BUN: 10 mg/dL (ref 6–23)
CO2: 27 mEq/L (ref 19–32)
Calcium: 9.1 mg/dL (ref 8.4–10.5)
Glucose, Bld: 85 mg/dL (ref 70–99)
Potassium: 3.7 mEq/L (ref 3.5–5.1)
Sodium: 139 mEq/L (ref 135–145)

## 2012-05-15 SURGERY — ROBOTIC ASSISTED TOTAL HYSTERECTOMY
Anesthesia: General | Site: Abdomen | Wound class: Clean Contaminated

## 2012-05-15 MED ORDER — GLYCOPYRROLATE 0.2 MG/ML IJ SOLN
INTRAMUSCULAR | Status: AC
Start: 2012-05-15 — End: 2012-05-15
  Filled 2012-05-15: qty 1

## 2012-05-15 MED ORDER — FENTANYL CITRATE 0.05 MG/ML IJ SOLN
INTRAMUSCULAR | Status: DC | PRN
  Administered 2012-05-15: 50 ug via INTRAVENOUS
  Administered 2012-05-15 (×2): 100 ug via INTRAVENOUS
  Administered 2012-05-15 (×2): 50 ug via INTRAVENOUS

## 2012-05-15 MED ORDER — DIPHENHYDRAMINE HCL 12.5 MG/5ML PO ELIX
12.5000 mg | ORAL_SOLUTION | Freq: Four times a day (QID) | ORAL | Status: DC | PRN
  Filled 2012-05-15: qty 5

## 2012-05-15 MED ORDER — HYDROMORPHONE HCL PF 1 MG/ML IJ SOLN
INTRAMUSCULAR | Status: AC
  Filled 2012-05-15: qty 1

## 2012-05-15 MED ORDER — ZOLPIDEM TARTRATE 5 MG PO TABS: 5.0000 mg | ORAL_TABLET | Freq: Every evening | ORAL | Status: DC | PRN

## 2012-05-15 MED ORDER — GLYCOPYRROLATE 0.2 MG/ML IJ SOLN
INTRAMUSCULAR | Status: AC
  Filled 2012-05-15: qty 4

## 2012-05-15 MED ORDER — OXYCODONE-ACETAMINOPHEN 5-325 MG PO TABS: 1.0000 | ORAL_TABLET | ORAL | Status: DC | PRN

## 2012-05-15 MED ORDER — PROPOFOL 10 MG/ML IV EMUL
INTRAVENOUS | Status: AC
  Filled 2012-05-15: qty 20

## 2012-05-15 MED ORDER — ACETAMINOPHEN 10 MG/ML IV SOLN
INTRAVENOUS | Status: AC
  Filled 2012-05-15: qty 100

## 2012-05-15 MED ORDER — NALOXONE HCL 0.4 MG/ML IJ SOLN: 0.4000 mg | INTRAMUSCULAR | Status: DC | PRN

## 2012-05-15 MED ORDER — LACTATED RINGERS IV SOLN
INTRAVENOUS | Status: DC
  Administered 2012-05-15 (×2): via INTRAVENOUS

## 2012-05-15 MED ORDER — MEPERIDINE HCL 25 MG/ML IJ SOLN
12.5000 mg | Freq: Once | INTRAMUSCULAR | Status: AC
Start: 2012-05-15 — End: 2012-05-15
  Administered 2012-05-15: 12.5 mg via INTRAVENOUS

## 2012-05-15 MED ORDER — EPHEDRINE SULFATE 50 MG/ML IJ SOLN
INTRAMUSCULAR | Status: DC | PRN
  Administered 2012-05-15: 10 mg via INTRAVENOUS

## 2012-05-15 MED ORDER — ARTIFICIAL TEARS OP OINT
TOPICAL_OINTMENT | OPHTHALMIC | Status: DC | PRN
  Administered 2012-05-15: 1 via OPHTHALMIC

## 2012-05-15 MED ORDER — FENTANYL CITRATE 0.05 MG/ML IJ SOLN
INTRAMUSCULAR | Status: AC
Start: 2012-05-15 — End: 2012-05-15
  Filled 2012-05-15: qty 5

## 2012-05-15 MED ORDER — SODIUM CHLORIDE 0.9 % IJ SOLN: 9.0000 mL | INTRAMUSCULAR | Status: DC | PRN

## 2012-05-15 MED ORDER — LIDOCAINE HCL (CARDIAC) 20 MG/ML IV SOLN
INTRAVENOUS | Status: AC
Start: 2012-05-15 — End: 2012-05-15
  Filled 2012-05-15: qty 5

## 2012-05-15 MED ORDER — ONDANSETRON HCL 4 MG/2ML IJ SOLN
INTRAMUSCULAR | Status: DC | PRN
  Administered 2012-05-15: 4 mg via INTRAVENOUS

## 2012-05-15 MED ORDER — ROCURONIUM BROMIDE 50 MG/5ML IV SOLN
INTRAVENOUS | Status: AC
  Filled 2012-05-15: qty 2

## 2012-05-15 MED ORDER — GLYCOPYRROLATE 0.2 MG/ML IJ SOLN
INTRAMUSCULAR | Status: DC | PRN
  Administered 2012-05-15: 0.2 mg via INTRAVENOUS
  Administered 2012-05-15: 0.1 mg via INTRAVENOUS
  Administered 2012-05-15: 0.6 mg via INTRAVENOUS

## 2012-05-15 MED ORDER — NEOSTIGMINE METHYLSULFATE 1 MG/ML IJ SOLN
INTRAMUSCULAR | Status: DC | PRN
  Administered 2012-05-15: 4 mg via INTRAVENOUS

## 2012-05-15 MED ORDER — DEXAMETHASONE SODIUM PHOSPHATE 10 MG/ML IJ SOLN
INTRAMUSCULAR | Status: DC | PRN
Start: 2012-05-15 — End: 2012-05-15
  Administered 2012-05-15: 10 mg via INTRAVENOUS

## 2012-05-15 MED ORDER — FENTANYL CITRATE 0.05 MG/ML IJ SOLN
INTRAMUSCULAR | Status: AC
  Administered 2012-05-15: 25 ug via INTRAVENOUS
  Filled 2012-05-15: qty 2

## 2012-05-15 MED ORDER — HYDROMORPHONE 0.3 MG/ML IV SOLN
INTRAVENOUS | Status: DC
  Administered 2012-05-15: 12:00:00 via INTRAVENOUS
  Filled 2012-05-15: qty 25

## 2012-05-15 MED ORDER — TRAMADOL HCL 50 MG PO TABS: 50.0000 mg | ORAL_TABLET | Freq: Four times a day (QID) | ORAL | Status: DC | PRN

## 2012-05-15 MED ORDER — ONDANSETRON HCL 4 MG/2ML IJ SOLN: 4.0000 mg | Freq: Four times a day (QID) | INTRAMUSCULAR | Status: DC | PRN

## 2012-05-15 MED ORDER — PROPOFOL 10 MG/ML IV EMUL
INTRAVENOUS | Status: DC | PRN
  Administered 2012-05-15: 200 mg via INTRAVENOUS

## 2012-05-15 MED ORDER — ROCURONIUM BROMIDE 100 MG/10ML IV SOLN
INTRAVENOUS | Status: DC | PRN
  Administered 2012-05-15: 40 mg via INTRAVENOUS
  Administered 2012-05-15: 20 mg via INTRAVENOUS
  Administered 2012-05-15: 10 mg via INTRAVENOUS

## 2012-05-15 MED ORDER — ONDANSETRON HCL 4 MG/2ML IJ SOLN
INTRAMUSCULAR | Status: AC
  Filled 2012-05-15: qty 2

## 2012-05-15 MED ORDER — NEOSTIGMINE METHYLSULFATE 1 MG/ML IJ SOLN
INTRAMUSCULAR | Status: AC
  Filled 2012-05-15: qty 10

## 2012-05-15 MED ORDER — CEFAZOLIN SODIUM-DEXTROSE 2-3 GM-% IV SOLR
2.0000 g | INTRAVENOUS | Status: AC
  Administered 2012-05-15: 2 g via INTRAVENOUS

## 2012-05-15 MED ORDER — ACETAMINOPHEN 10 MG/ML IV SOLN
1000.0000 mg | Freq: Once | INTRAVENOUS | Status: AC
  Administered 2012-05-15: 1000 mg via INTRAVENOUS

## 2012-05-15 MED ORDER — HYDROMORPHONE HCL PF 1 MG/ML IJ SOLN
INTRAMUSCULAR | Status: DC | PRN
  Administered 2012-05-15 (×2): 1 mg via INTRAVENOUS

## 2012-05-15 MED ORDER — BUPIVACAINE HCL (PF) 0.25 % IJ SOLN
INTRAMUSCULAR | Status: DC | PRN
  Administered 2012-05-15: 10 mL

## 2012-05-15 MED ORDER — MEPERIDINE HCL 25 MG/ML IJ SOLN
INTRAMUSCULAR | Status: AC
  Administered 2012-05-15: 12.5 mg via INTRAVENOUS
  Filled 2012-05-15: qty 1

## 2012-05-15 MED ORDER — DEXAMETHASONE SODIUM PHOSPHATE 10 MG/ML IJ SOLN
INTRAMUSCULAR | Status: AC
  Filled 2012-05-15: qty 1

## 2012-05-15 MED ORDER — KETOROLAC TROMETHAMINE 30 MG/ML IJ SOLN
15.0000 mg | Freq: Once | INTRAMUSCULAR | Status: AC | PRN
  Administered 2012-05-15: 30 mg via INTRAVENOUS

## 2012-05-15 MED ORDER — DEXTROSE-NACL 5-0.45 % IV SOLN
INTRAVENOUS | Status: DC
  Administered 2012-05-15: 11:00:00 via INTRAVENOUS

## 2012-05-15 MED ORDER — DIPHENHYDRAMINE HCL 50 MG/ML IJ SOLN: 12.5000 mg | Freq: Four times a day (QID) | INTRAMUSCULAR | Status: DC | PRN

## 2012-05-15 MED ORDER — BUPIVACAINE HCL (PF) 0.25 % IJ SOLN
INTRAMUSCULAR | Status: AC
  Filled 2012-05-15: qty 30

## 2012-05-15 MED ORDER — MIDAZOLAM HCL 2 MG/2ML IJ SOLN
INTRAMUSCULAR | Status: AC
  Filled 2012-05-15: qty 2

## 2012-05-15 MED ORDER — MIDAZOLAM HCL 5 MG/5ML IJ SOLN
INTRAMUSCULAR | Status: DC | PRN
  Administered 2012-05-15: 2 mg via INTRAVENOUS

## 2012-05-15 MED ORDER — FENTANYL CITRATE 0.05 MG/ML IJ SOLN
25.0000 ug | INTRAMUSCULAR | Status: DC | PRN
  Administered 2012-05-15: 25 ug via INTRAVENOUS
  Administered 2012-05-15: 50 ug via INTRAVENOUS

## 2012-05-15 MED ORDER — CEFAZOLIN SODIUM-DEXTROSE 2-3 GM-% IV SOLR
INTRAVENOUS | Status: AC
  Filled 2012-05-15: qty 50

## 2012-05-15 MED ORDER — FENTANYL CITRATE 0.05 MG/ML IJ SOLN
INTRAMUSCULAR | Status: AC
  Filled 2012-05-15: qty 2

## 2012-05-15 MED ORDER — KETOROLAC TROMETHAMINE 30 MG/ML IJ SOLN
INTRAMUSCULAR | Status: AC
  Administered 2012-05-15: 30 mg via INTRAVENOUS
  Filled 2012-05-15: qty 1

## 2012-05-15 MED ORDER — EPHEDRINE 5 MG/ML INJ
INTRAVENOUS | Status: AC
  Filled 2012-05-15: qty 10

## 2012-05-15 SURGICAL SUPPLY — 53 items
ADH SKN CLS APL DERMABOND .7 (GAUZE/BANDAGES/DRESSINGS) ×3
BAG URINE DRAINAGE (UROLOGICAL SUPPLIES) ×4 IMPLANT
CABLE HIGH FREQUENCY MONO STRZ (ELECTRODE) ×4 IMPLANT
CATH FOLEY 3WAY  5CC 16FR (CATHETERS) ×1
CATH FOLEY 3WAY 5CC 16FR (CATHETERS) ×3 IMPLANT
CHLORAPREP W/TINT 26ML (MISCELLANEOUS) ×4 IMPLANT
CLOTH BEACON ORANGE TIMEOUT ST (SAFETY) ×4 IMPLANT
CONT PATH 16OZ SNAP LID 3702 (MISCELLANEOUS) ×4 IMPLANT
COVER MAYO STAND STRL (DRAPES) ×4 IMPLANT
COVER TABLE BACK 60X90 (DRAPES) ×8 IMPLANT
COVER TIP SHEARS 8 DVNC (MISCELLANEOUS) ×3 IMPLANT
COVER TIP SHEARS 8MM DA VINCI (MISCELLANEOUS) ×1
DERMABOND ADVANCED (GAUZE/BANDAGES/DRESSINGS) ×1
DERMABOND ADVANCED .7 DNX12 (GAUZE/BANDAGES/DRESSINGS) ×3 IMPLANT
DRAPE HUG U DISPOSABLE (DRAPE) ×4 IMPLANT
DRAPE LG THREE QUARTER DISP (DRAPES) ×8 IMPLANT
DRAPE WARM FLUID 44X44 (DRAPE) ×4 IMPLANT
ELECT REM PT RETURN 9FT ADLT (ELECTROSURGICAL) ×4
ELECTRODE REM PT RTRN 9FT ADLT (ELECTROSURGICAL) ×3 IMPLANT
EVACUATOR SMOKE 8.L (FILTER) ×4 IMPLANT
GLOVE BIO SURGEON STRL SZ 6.5 (GLOVE) ×2 IMPLANT
GLOVE BIO SURGEON STRL SZ7.5 (GLOVE) ×12 IMPLANT
GOWN STRL REIN XL XLG (GOWN DISPOSABLE) ×24 IMPLANT
GYRUS RUMI II 3.5CM BLUE (DISPOSABLE) ×4
KIT ACCESSORY DA VINCI DISP (KITS) ×1
KIT ACCESSORY DVNC DISP (KITS) ×3 IMPLANT
NDL INSUFFLATION 14GA 120MM (NEEDLE) ×3 IMPLANT
NEEDLE INSUFFLATION 14GA 120MM (NEEDLE) ×4 IMPLANT
PACK LAVH (CUSTOM PROCEDURE TRAY) ×4 IMPLANT
PAD PREP 24X48 CUFFED NSTRL (MISCELLANEOUS) ×8 IMPLANT
PLUG CATH AND CAP STER (CATHETERS) ×4 IMPLANT
PROTECTOR NERVE ULNAR (MISCELLANEOUS) ×9 IMPLANT
RUMI II GYRUS 3.5CM BLUE (DISPOSABLE) IMPLANT
SET IRRIG TUBING LAPAROSCOPIC (IRRIGATION / IRRIGATOR) ×4 IMPLANT
SOLUTION ELECTROLUBE (MISCELLANEOUS) ×4 IMPLANT
SUT VIC AB 0 CT1 27 (SUTURE) ×8
SUT VIC AB 0 CT1 27XBRD ANBCTR (SUTURE) ×6 IMPLANT
SUT VIC AB 0 CT1 27XBRD ANTBC (SUTURE) IMPLANT
SUT VICRYL 0 UR6 27IN ABS (SUTURE) ×4 IMPLANT
SUT VICRYL RAPIDE 4/0 PS 2 (SUTURE) ×8 IMPLANT
SUT VLOC 180 0 9IN  GS21 (SUTURE) ×1
SUT VLOC 180 0 9IN GS21 (SUTURE) IMPLANT
SYR 50ML LL SCALE MARK (SYRINGE) ×4 IMPLANT
SYRINGE 10CC LL (SYRINGE) ×4 IMPLANT
TIP UTERINE 6.7X8CM BLUE DISP (MISCELLANEOUS) ×1 IMPLANT
TOWEL OR 17X24 6PK STRL BLUE (TOWEL DISPOSABLE) ×12 IMPLANT
TROCAR DISP BLADELESS 8 DVNC (TROCAR) ×3 IMPLANT
TROCAR DISP BLADELESS 8MM (TROCAR) ×1
TROCAR XCEL NON-BLD 5MMX100MML (ENDOMECHANICALS) ×4 IMPLANT
TROCAR Z-THREAD 12X150 (TROCAR) ×4 IMPLANT
TUBING FILTER THERMOFLATOR (ELECTROSURGICAL) ×4 IMPLANT
WARMER LAPAROSCOPE (MISCELLANEOUS) ×4 IMPLANT
WATER STERILE IRR 1000ML POUR (IV SOLUTION) ×12 IMPLANT

## 2012-05-15 NOTE — Transfer of Care (Signed)
Immediate Anesthesia Transfer of Care Note  Patient: Katie Rice  Procedure(s) Performed: Procedure(s) (LRB) with comments: ROBOTIC ASSISTED TOTAL HYSTERECTOMY (N/A) BILATERAL SALPINGECTOMY (Bilateral) ROBOTIC ASSISTED SALPINGO OOPHERECTOMY (Left) - Left Oopherectomy  Patient Location: PACU  Anesthesia Type: General  Level of Consciousness: awake, alert  and oriented  Airway & Oxygen Therapy: Patient Spontanous Breathing and Patient connected to nasal cannula oxygen  Post-op Assessment: Report given to PACU RN and Post -op Vital signs reviewed and stable  Post vital signs: Reviewed and stable  Complications: No apparent anesthesia complications

## 2012-05-15 NOTE — Anesthesia Preprocedure Evaluation (Addendum)
Anesthesia Evaluation  Patient identified by MRN, date of birth, ID band Patient awake    Reviewed: Allergy & Precautions, H&P , NPO status , Patient's Chart, lab work & pertinent test results, reviewed documented beta blocker date and time   Airway Mallampati: I TM Distance: >3 FB Neck ROM: full    Dental  (+) Teeth Intact   Pulmonary neg pulmonary ROS,  breath sounds clear to auscultation  Pulmonary exam normal       Cardiovascular Exercise Tolerance: Good negative cardio ROS  Rhythm:regular Rate:Normal     Neuro/Psych Has bulging disc in neck - asymptomatic.  Will maintain neck in neutral position negative neurological ROS  negative psych ROS   GI/Hepatic negative GI ROS, Neg liver ROS,   Endo/Other  negative endocrine ROS  Renal/GU negative Renal ROS  Female GU complaint (on lorcet - takes twice a day for the past two weeks)     Musculoskeletal   Abdominal   Peds  Hematology negative hematology ROS (+)   Anesthesia Other Findings   Reproductive/Obstetrics negative OB ROS                           Anesthesia Physical Anesthesia Plan  ASA: I  Anesthesia Plan: General   Post-op Pain Management:    Induction: Intravenous  Airway Management Planned: Oral ETT  Additional Equipment:   Intra-op Plan:   Post-operative Plan: Extubation in OR  Informed Consent: I have reviewed the patients History and Physical, chart, labs and discussed the procedure including the risks, benefits and alternatives for the proposed anesthesia with the patient or authorized representative who has indicated his/her understanding and acceptance.   Dental Advisory Given  Plan Discussed with: CRNA and Surgeon  Anesthesia Plan Comments:        Anesthesia Quick Evaluation

## 2012-05-15 NOTE — Progress Notes (Signed)
Pt is discharged in the care of husband. Downstairs per wheelchairs . Stable. Lapsites are clean and dry. Spirits are good. Denies any vaginal bleeding. Pain is improved.. Stable.

## 2012-05-15 NOTE — Addendum Note (Signed)
Addendum  created 05/15/12 1720 by Jelissa Espiritu S Okechukwu Regnier, CRNA   Modules edited:Notes Section    

## 2012-05-15 NOTE — Op Note (Signed)
05/15/2012  9:28 AM  PATIENT:  Katie Rice  46 y.o. female  PRE-OPERATIVE DIAGNOSIS:  Pelvic Pain LLQ Pain Dysmenorrhea  POST-OPERATIVE DIAGNOSIS:  Pelvic Pain Enterocele  PROCEDURE:  Procedure(s): ROBOTIC ASSISTED TOTAL HYSTERECTOMY BILATERAL SALPINGECTOMY ROBOTIC ASSISTED SALPINGO OOPHERECTOMY(LEFT) LYSIS OF BOWEL ADHESIONS MCCALL CUL DE PLASTY  SURGEON:  Surgeon(s): Lenoard Aden, MD  ASSISTANTS: Fredric Mare, CNM  ANESTHESIA:   local and general  ESTIMATED BLOOD LOSS: 50cc  DRAINS: Urinary Catheter (Foley)   LOCAL MEDICATIONS USED:  MARCAINE     SPECIMEN:  Source of Specimen:  uterus , cervix, LSO, Right tube  DISPOSITION OF SPECIMEN:  PATHOLOGY  COUNTS:  YES  DICTATION #: [id number]  PLAN OF CARE: DC home today  PATIENT DISPOSITION:  PACU - hemodynamically stable.

## 2012-05-15 NOTE — Anesthesia Procedure Notes (Signed)
Procedure Name: Intubation Date/Time: 05/15/2012 7:44 AM Performed by: Graciela Husbands Pre-anesthesia Checklist: Suction available, Emergency Drugs available, Timeout performed, Patient identified and Patient being monitored Patient Re-evaluated:Patient Re-evaluated prior to inductionOxygen Delivery Method: Circle system utilized Preoxygenation: Pre-oxygenation with 100% oxygen Intubation Type: IV induction Ventilation: Mask ventilation without difficulty Laryngoscope Size: Mac and 3 Grade View: Grade I Tube type: Oral Tube size: 7.0 mm Number of attempts: 1 Airway Equipment and Method: Stylet (Minimal manipulation of the neck. Head returned to neutral position.) Placement Confirmation: ETT inserted through vocal cords under direct vision,  positive ETCO2 and breath sounds checked- equal and bilateral Secured at: 21 cm Tube secured with: Tape Dental Injury: Teeth and Oropharynx as per pre-operative assessment

## 2012-05-15 NOTE — Progress Notes (Signed)
Patient ID: Katie Rice, female   DOB: 11/16/1965, 46 y.o.   MRN: [id number] Patient seen and examined. Consent witnessed and signed. No changes noted. Update completed.

## 2012-05-15 NOTE — Anesthesia Postprocedure Evaluation (Signed)
  Anesthesia Post-op Note  Patient: Katie Rice  Procedure(s) Performed: Procedure(s) (LRB) with comments: ROBOTIC ASSISTED TOTAL HYSTERECTOMY (N/A) BILATERAL SALPINGECTOMY (Bilateral) ROBOTIC ASSISTED SALPINGO OOPHERECTOMY (Left) - Left Oopherectomy  Patient Location: PACU and Women's Unit  Anesthesia Type: General  Level of Consciousness: awake, alert  and oriented  Airway and Oxygen Therapy: Patient Spontanous Breathing  Post-op Pain: mild  Post-op Assessment: Patient's Cardiovascular Status Stable, Respiratory Function Stable, NAUSEA AND VOMITING PRESENT and Pain level controlled  Post-op Vital Signs: stable  Complications: No apparent anesthesia complications

## 2012-05-15 NOTE — Anesthesia Postprocedure Evaluation (Signed)
Anesthesia Post Note  Patient: Katie Rice  Procedure(s) Performed: Procedure(s) (LRB): ROBOTIC ASSISTED TOTAL HYSTERECTOMY (N/A) BILATERAL SALPINGECTOMY (Bilateral) ROBOTIC ASSISTED SALPINGO OOPHERECTOMY (Left)  Anesthesia type: General  Patient location: PACU  Post pain: Pain level controlled  Post assessment: Post-op Vital signs reviewed  Last Vitals:  Filed Vitals:   05/15/12 1000  BP: 98/62  Pulse: 55  Temp:   Resp:     Post vital signs: Reviewed  Level of consciousness: sedated  Complications: No apparent anesthesia complicationsfj

## 2012-05-16 NOTE — Addendum Note (Signed)
Addendum  created 05/16/12 1330 by Sandrea Hughs., MD   Modules edited:Charting, Inpatient Notes

## 2012-05-16 NOTE — Op Note (Signed)
NAMEADLINE, KIRSHENBAUM NO.:  [id number]  MEDICAL RECORD NO.:  [id number]  LOCATION:  9305                          FACILITY:  [facility]  PHYSICIAN:  Lenoard Aden, M.D.DATE OF BIRTH:  04/06/1966  DATE OF PROCEDURE: DATE OF DISCHARGE:  05/15/2012                              OPERATIVE REPORT   DESCRIPTION OF PROCEDURE:  After being apprised of the risks of anesthesia, infection, bleeding, injury to abdominal organs, need for repair, delayed versus immediate complications to include bowel and bladder injury, possible need for repair, and also possible inability to cure atypical pelvic pain.  The patient's consents were signed.  She was brought to the operating room, where she administered general anesthetic without complications.  Prepped and draped in usual sterile fashion. Foley catheter placed.  RUMI retractor placed per vagina in a standard fashion.  Infraumbilical incision made with a scalpel.  Veress needle placed, opening pressure -2, 5 L of CO2 insufflated without difficulty. A 12 mm trocar placed atraumatically.  Visualization revealed atraumatic trocar entry and normal anterior cul-de-sac.  Extensive adhesions to the left sigmoid mesentery to the left adnexa obscuring the left adnexa. Bilateral normal appearing ovaries, previously interrupted tubes, normal anterior and posterior cul-de-sac.  At this time, the ureter was identified on the left and then the robotic ports are placed, 1 on the right, 1 on the left, and a 5-mm port on the left.  The robot was docked after achieving steep Trendelenburg position and is docked in a standard fashion.  PK forceps and Endo Shears were placed.  The bowel adhesions on the left are sharply lysed exposing the now otherwise normal left adnexa.  Left ureter is identified.  The retroperitoneal space was entered.  The ureter was dissected sharply off the medial leaf of the peritoneum.  The infundibulopelvic ligament on the  left was skeletonized, cauterized in a 3-point fashion with bipolar cautery and divided.  Good hemostasis was noted.  The bladder flap was developed sharply after division of the round ligament in a sharp fashion.  The left uterine vessels were skeletonized, cauterized but not cut.  At this time, attention was turned to the right side where the right tube is excised along with mesosalpinx and placed in the cul-de-sac.  The right ureter having been identified, the retroperitoneal space was entered on the right.  The right ovarian ligament was cauterized and divided.  The ureter was further dissected off the medial leaf of the peritoneum.  The bladder flap was sharply developed.  The round ligament was divided sharply with good hemostasis.  The right uterine vessels were cauterized and divided, exposing after being skeletonized on the right.  The RUMI cup was then palpated through the cervical vaginal junction and is circumscribed in a 360 degree fashion, exposing the RUMI cup and detaching the specimen.  The specimen was retracted into the vagina in addition to placing the right tube and the vagina.  The occluder was then replaced in the vagina.  Hemostasis was achieved.  Vaginal closure was achieved with a 0 V-Loc suture in a continuous running fashion. Second imbricating layer placed.  Culdoplasty sutures placed with 0 V- Loc as well,  achieving a secure [employer].  This suture was then removed.  Irrigation was accomplished.  Good hemostasis was noted. Clear urine was noted.  Bilateral normal ureters were peristalsing.  The right ovary appears normal.  At this time, all instruments were removed under direct visualization.  The robot was undocked.  The CO2 was released.  The ports were removed.  The incisions were closed using 0 Vicryl, 4-0 Vicryl, and Dermabond.  Vaginal exam reveals a well- approximated vaginal cuff.  The patient tolerated the procedure well, was awakened, and  transferred to recovery in good condition.     Lenoard Aden, M.D.     RJT/MEDQ  D:  05/15/2012  T:  05/16/2012  Job:  [id number]

## 2012-05-29 ENCOUNTER — Ambulatory Visit
Admission: RE | Admit: 2012-05-29 | Discharge: 2012-05-29 | Disposition: A | Discharge status: Home / Self Care | Payer: BC Managed Care – PPO | Source: Ambulatory Visit | Attending: Obstetrics and Gynecology | Admitting: Obstetrics and Gynecology

## 2012-05-29 DIAGNOSIS — Z1231 Encounter for screening mammogram for malignant neoplasm of breast: Secondary | ICD-10-CM

## 2012-08-23 ENCOUNTER — Observation Stay (HOSPITAL_BASED_OUTPATIENT_CLINIC_OR_DEPARTMENT_OTHER)
Admission: EM | Admit: 2012-08-23 | Discharge: 2012-08-24 | Disposition: A | Discharge status: Home / Self Care | Payer: Managed Care, Other (non HMO) | Attending: Internal Medicine | Admitting: Internal Medicine

## 2012-08-23 ENCOUNTER — Emergency Department (HOSPITAL_BASED_OUTPATIENT_CLINIC_OR_DEPARTMENT_OTHER): Payer: Managed Care, Other (non HMO)

## 2012-08-23 ENCOUNTER — Encounter (HOSPITAL_BASED_OUTPATIENT_CLINIC_OR_DEPARTMENT_OTHER): Payer: Self-pay | Admitting: *Deleted

## 2012-08-23 DIAGNOSIS — M79609 Pain in unspecified limb: Secondary | ICD-10-CM | POA: Insufficient documentation

## 2012-08-23 DIAGNOSIS — R079 Chest pain, unspecified: Secondary | ICD-10-CM

## 2012-08-23 DIAGNOSIS — R109 Unspecified abdominal pain: Secondary | ICD-10-CM | POA: Insufficient documentation

## 2012-08-23 DIAGNOSIS — R0602 Shortness of breath: Secondary | ICD-10-CM

## 2012-08-23 DIAGNOSIS — R110 Nausea: Secondary | ICD-10-CM | POA: Insufficient documentation

## 2012-08-23 DIAGNOSIS — R0789 Other chest pain: Principal | ICD-10-CM | POA: Insufficient documentation

## 2012-08-23 LAB — CBC WITH DIFFERENTIAL/PLATELET
Basophils Absolute: 0 10*3/uL (ref 0.0–0.1)
Basophils Relative: 0 % (ref 0–1)
Hemoglobin: 14 g/dL (ref 12.0–15.0)
Lymphocytes Relative: 21 % (ref 12–46)
MCHC: 33.9 g/dL (ref 30.0–36.0)
Monocytes Relative: 7 % (ref 3–12)
Neutro Abs: 6.8 10*3/uL (ref 1.7–7.7)
Neutrophils Relative %: 71 % (ref 43–77)
WBC: 9.6 10*3/uL (ref 4.0–10.5)

## 2012-08-23 LAB — COMPREHENSIVE METABOLIC PANEL
AST: 14 U/L (ref 0–37)
Albumin: 4.4 g/dL (ref 3.5–5.2)
Alkaline Phosphatase: 51 U/L (ref 39–117)
CO2: 25 mEq/L (ref 19–32)
Chloride: 101 mEq/L (ref 96–112)
GFR calc non Af Amer: 76 mL/min — ABNORMAL LOW (ref >90)
Potassium: 3.9 mEq/L (ref 3.5–5.1)
Total Bilirubin: 0.2 mg/dL — ABNORMAL LOW (ref 0.3–1.2)

## 2012-08-23 LAB — D-DIMER, QUANTITATIVE: D-Dimer, Quant: 0.3 ug/mL-FEU (ref 0.00–0.48)

## 2012-08-23 MED ORDER — ONDANSETRON HCL 4 MG PO TABS: 4.0000 mg | ORAL_TABLET | Freq: Four times a day (QID) | ORAL | Status: DC | PRN

## 2012-08-23 MED ORDER — ZOLPIDEM TARTRATE 5 MG PO TABS
5.0000 mg | ORAL_TABLET | Freq: Every evening | ORAL | Status: DC | PRN
  Administered 2012-08-23: 5 mg via ORAL
  Filled 2012-08-23: qty 1

## 2012-08-23 MED ORDER — ALUM & MAG HYDROXIDE-SIMETH 200-200-20 MG/5ML PO SUSP: 30.0000 mL | Freq: Four times a day (QID) | ORAL | Status: DC | PRN

## 2012-08-23 MED ORDER — OXYCODONE HCL 5 MG PO TABS
5.0000 mg | ORAL_TABLET | ORAL | Status: DC | PRN
  Administered 2012-08-23 – 2012-08-24 (×2): 5 mg via ORAL
  Filled 2012-08-23 (×2): qty 1

## 2012-08-23 MED ORDER — ONDANSETRON HCL 4 MG/2ML IJ SOLN
4.0000 mg | Freq: Four times a day (QID) | INTRAMUSCULAR | Status: DC | PRN
  Administered 2012-08-23 – 2012-08-24 (×2): 4 mg via INTRAVENOUS
  Filled 2012-08-23 (×3): qty 2

## 2012-08-23 MED ORDER — NITROGLYCERIN 2 % TD OINT
0.5000 [in_us] | TOPICAL_OINTMENT | Freq: Four times a day (QID) | TRANSDERMAL | Status: DC
  Filled 2012-08-23: qty 30

## 2012-08-23 MED ORDER — ACETAMINOPHEN 325 MG PO TABS
650.0000 mg | ORAL_TABLET | Freq: Four times a day (QID) | ORAL | Status: DC | PRN
  Filled 2012-08-23: qty 2

## 2012-08-23 MED ORDER — HYDROMORPHONE HCL PF 1 MG/ML IJ SOLN
0.5000 mg | INTRAMUSCULAR | Status: DC | PRN
  Administered 2012-08-23: 0.5 mg via INTRAVENOUS
  Administered 2012-08-24: 1 mg via INTRAVENOUS
  Filled 2012-08-23 (×2): qty 1

## 2012-08-23 MED ORDER — ACETAMINOPHEN 650 MG RE SUPP: 650.0000 mg | Freq: Four times a day (QID) | RECTAL | Status: DC | PRN

## 2012-08-23 MED ORDER — ASPIRIN EC 325 MG PO TBEC
325.0000 mg | DELAYED_RELEASE_TABLET | Freq: Every day | ORAL | Status: DC
  Administered 2012-08-24: 325 mg via ORAL
  Filled 2012-08-23: qty 1

## 2012-08-23 MED ORDER — SODIUM CHLORIDE 0.9 % IV SOLN
INTRAVENOUS | Status: DC
  Administered 2012-08-23 – 2012-08-24 (×2): via INTRAVENOUS

## 2012-08-23 MED ORDER — SODIUM CHLORIDE 0.9 % IV SOLN
INTRAVENOUS | Status: AC
  Administered 2012-08-23: 20:00:00 via INTRAVENOUS

## 2012-08-23 MED ORDER — PENTOSAN POLYSULFATE SODIUM 100 MG PO CAPS
200.0000 mg | ORAL_CAPSULE | Freq: Three times a day (TID) | ORAL | Status: DC
  Administered 2012-08-23 – 2012-08-24 (×2): 200 mg via ORAL
  Filled 2012-08-23 (×5): qty 2

## 2012-08-23 MED ORDER — NITROGLYCERIN 2 % TD OINT
1.0000 [in_us] | TOPICAL_OINTMENT | Freq: Once | TRANSDERMAL | Status: AC
  Administered 2012-08-23: 1 [in_us] via TOPICAL
  Filled 2012-08-23: qty 1

## 2012-08-23 MED ORDER — ENOXAPARIN SODIUM 40 MG/0.4ML ~~LOC~~ SOLN
40.0000 mg | SUBCUTANEOUS | Status: DC
  Administered 2012-08-23: 40 mg via SUBCUTANEOUS
  Filled 2012-08-23 (×2): qty 0.4

## 2012-08-23 MED ORDER — ASPIRIN 81 MG PO CHEW
324.0000 mg | CHEWABLE_TABLET | Freq: Once | ORAL | Status: AC
  Administered 2012-08-23: 324 mg via ORAL
  Filled 2012-08-23: qty 4

## 2012-08-23 MED ORDER — NITROGLYCERIN 0.4 MG SL SUBL
0.4000 mg | SUBLINGUAL_TABLET | SUBLINGUAL | Status: DC | PRN
  Administered 2012-08-23: 0.4 mg via SUBLINGUAL
  Filled 2012-08-23: qty 25

## 2012-08-23 NOTE — H&P (Signed)
[facility] History and Physical  DECLYN Rice RUE:[id number] DOB: 04-29-1966 DOA: 08/23/2012  Referring physician: EDP PCP: Aura Dials, MD  Specialists:   Chief Complaint: Chest Pain  HPI: Katie Rice is a 47 y.o. female who presented to the [facility] ED with complaints of intermittent left sided chest pain x 2 days.   Her pain started in her back the day before and the next AM, the pain was in the left  Chest and lower ABD and radiated into her left arm.  She reports having SOB, and nausea and diaphoresis associated with the pain.   The pain was described as a tightness and pressure in the chest , and the pain today was not relieved until she receive a NTG sl X 1, but returned again , and the NTG was repeated , and then she had to be given Morphine once.  Her cardiac workup was negative initially and she was referred to [facility] for further evaluation.      Review of Systems: The patient denies anorexia, fever, chills, weight loss, vision loss, decreased hearing, hoarseness, chest pain, syncope, dyspnea on exertion, peripheral edema, balance deficits, hemoptysis, abdominal pain, vomiting, diarrhea, hematemesis, melena, hematochezia, severe indigestion/heartburn, hematuria, incontinence, genital sores, muscle weakness, suspicious skin lesions, transient blindness, difficulty walking, depression, unusual weight change, abnormal bleeding, enlarged lymph nodes, angioedema, and breast masses.    Past Medical History  Diagnosis Date  . SVD (spontaneous vaginal delivery)     x 3  . Missed abortions     x 3 MAB 2 no surgery , 1 d & C   Past Surgical History  Procedure Date  . Tubal ligation   . Dilation and curettage of uterus   . Diagnostic laparoscopy   . Neck surgery     C-4 Disk removed -has plates/screws  . Colonoscopy     x 5  . Left foot surgery   . Hyperextension of bladder     x 2  . Interstem      battery in left hip for IC  . Abdominal hysterectomy       Medications:  HOME MEDS: Prior to Admission medications   Medication Sig Start Date End Date Taking? Authorizing Provider  diphenhydrAMINE (BENADRYL) 25 mg capsule Take 50 mg by mouth at bedtime.   Yes Historical Provider, MD  pentosan polysulfate (ELMIRON) 100 MG capsule Take 200 mg by mouth at bedtime.   Yes Historical Provider, MD    Allergies:  Allergies  Allergen Reactions  . Doxycycline Palpitations    Makes her heart race "really bad"    Social History:   reports that she quit smoking about 22 years ago. Her smoking use included Cigarettes. She has a .75 pack-year smoking history. She has never used smokeless tobacco. She reports that she drinks alcohol. She reports that she does not use illicit drugs.  Family History: Family History  Problem Relation Age of Onset  . CAD Father   . Hypertension Father   . Hypertension Mother   . Hypertension Brother   . Cancer - Ovarian Paternal Aunt   . Cancer - Other Maternal Aunt     Leukemia    Physical Exam:  GEN:  Pleasant 47 year old well nourished and well developed Caucasian Female examined  and in no acute distress; cooperative with exam Filed Vitals:   08/23/12 1726 08/23/12 1730 08/23/12 1815 08/23/12 2009  BP:  113/75 111/68 123/73  Pulse: 92 84 86 95  Temp:  98.6 F (37 C) 98.5 F (36.9 C)  TempSrc:   Oral Oral  Resp:   20 18  Height:    5\' 6"  (1.676 m)  Weight:    63.64 kg (140 lb 4.8 oz)  SpO2: 100% 100% 100% 100%   Blood pressure 123/73, pulse 95, temperature 98.5 F (36.9 C), temperature source Oral, resp. rate 18, height 5\' 6"  (1.676 m), weight 63.64 kg (140 lb 4.8 oz), last menstrual period 10/17/2010, SpO2 100.00%. PSYCH: She is alert and oriented x4; does not appear anxious does not appear depressed; affect is normal HEENT: Normocephalic and Atraumatic, Mucous membranes pink; PERRLA; EOM intact; Fundi:  Benign;  No scleral icterus, Nares: Patent, Oropharynx: Clear, Fair Dentition, Neck:  FROM, no  cervical lymphadenopathy nor thyromegaly or carotid bruit; no JVD; Breasts:: Not examined CHEST WALL: No tenderness CHEST: Normal respiration, clear to auscultation bilaterally HEART: Regular rate and rhythm; no murmurs rubs or gallops BACK: No kyphosis or scoliosis; no CVA tenderness ABDOMEN: Positive Bowel Sounds, soft non-tender; no masses, no organomegaly.    Rectal Exam: Not done EXTREMITIES: No cyanosis, clubbing or edema; no ulcerations. Genitalia: not examined PULSES: 2+ and symmetric SKIN: Normal hydration no rash or ulceration CNS: Cranial nerves 2-12 grossly intact no focal neurologic deficit    Labs on Admission:  Basic Metabolic Panel:  Lab 08/23/12 0981  NA 138  K 3.9  CL 101  CO2 25  GLUCOSE 105*  BUN 18  CREATININE 0.90  CALCIUM 9.6  MG --  PHOS --   Liver Function Tests:  Lab 08/23/12 1624  AST 14  ALT 11  ALKPHOS 51  BILITOT 0.2*  PROT 7.4  ALBUMIN 4.4   No results found for this basename: LIPASE:5,AMYLASE:5 in the last 168 hours No results found for this basename: AMMONIA:5 in the last 168 hours CBC:  Lab 08/23/12 1624  WBC 9.6  NEUTROABS 6.8  HGB 14.0  HCT 41.3  MCV 94.7  PLT 242   Cardiac Enzymes:  Lab 08/23/12 1624  CKTOTAL --  CKMB --  CKMBINDEX --  TROPONINI <0.30    BNP (last 3 results) No results found for this basename: PROBNP:3 in the last 8760 hours CBG: No results found for this basename: GLUCAP:5 in the last 168 hours  Radiological Exams on Admission: Dg Chest 2 View  08/23/2012  *RADIOLOGY REPORT*  Clinical Data: Chest pain  CHEST - 2 VIEW  Comparison: None.  Findings: Cardiomediastinal silhouette is unremarkable.  No acute infiltrate or pleural effusion.  No pulmonary edema.  Bony thorax is unremarkable.  IMPRESSION: No active disease.   Original Report Authenticated By: Natasha Mead, M.D.     EKG: Independently reviewed. Normal Sinus Rhythm. Assessment/Plan: Principal Problem:  *Atypical chest pain Active  Problems:  SOB (shortness of breath)   1.   Atypical Chest Pain- Rule out Cardiac Event and evaluate for Risk Stratification,  Check Troponins serially, and check fasting Lipids.                             Send  D-dimer.     Placed on Nitropaste, O2, and ASA therapy.   Consult Cards if needed.     2.   SOB-  Also check D-Dimer, CTA Chest if needed.      3.  Other-   Review and Reconcile Home Medications.   4.   DVT Prophylaxis with Lovenox.       Code Status:  FULL CODE Family Communication:   Family at bedside Disposition Plan:  Return to Home on Discharge  Time spent: 59 Minutes  Ron Parker [facility] Pager [phone]  If 7PM-7AM, please contact night-coverage [email or web address] Password [facility] 08/23/2012, 8:49 PM

## 2012-08-23 NOTE — ED Notes (Signed)
Patient states that she has been having chest pain/ pressure at night since Thursday. Today worsened

## 2012-08-23 NOTE — ED Provider Notes (Signed)
History   This chart was scribed for Rolan Bucco, MD scribed by Magnus Sinning. The patient was seen in room MH12/MH12 at 16:19.   CSN: [id number]  Arrival date & time 08/23/12  1600  Chief Complaint  Patient presents with  . Chest Pain    (Consider location/radiation/quality/duration/timing/severity/associated sxs/prior treatment) Patient is a 47 y.o. female presenting with chest pain. The history is provided by the patient. No language interpreter was used.  Chest Pain Primary symptoms include shortness of breath, abdominal pain and nausea. Pertinent negatives for primary symptoms include no fever, no fatigue, no cough, no vomiting and no dizziness.  Associated symptoms include diaphoresis.  Pertinent negatives for associated symptoms include no numbness and no weakness.    Katie Rice is a 47 y.o. female who presents to the Emergency Department complaining of intermittent moderate chest pain described as pressure and tightness, onset two nights ago with associated difficulty sleeping, SOB,nausea, diaphoresis that is worsened with exertion, left neck and shoulder tingling, left arm  described as heaviness. Pt states previous mild intermittent abd pain, but says none currently.  The pt explains that two days ago she started having lower back pain, and says when that resolved she began having intermittent chest pain since that is aggravated with exertion.  Says that she gets increased SOB and chest heaviness with minimal exertion.  She denies any pleuritic pain.  No calf pain or swelling.  No hx of VTE.  She notes hx of chest pain and says it has been intermittent for past few months and she notes the pain never last very long.  She says she has had lower left leg pain and swelling, but states she associated these sxs with interstim treatment for interstitial cystitis. She says she has a battery in her left hip and that she was having pain to the left leg following interstim. She  explains the lead was moved and the pain has eased, but mild swelling and pain persist.   She denies vomiting and says she is taking elmiron for IC and denies hx of cholesterol, or HTN.   PCP: Dr. Everlene Other Past Medical History  Diagnosis Date  . SVD (spontaneous vaginal delivery)     x 3  . Missed abortions     x 3 MAB 2 no surgery , 1 d & C    Past Surgical History  Procedure Date  . Tubal ligation   . Dilation and curettage of uterus   . Diagnostic laparoscopy   . Neck surgery     C-4 Disk removed -has plates/screws  . Colonoscopy     x 5  . Left foot surgery   . Hyperextension of bladder     x 2  . Interstem      battery in left hip for IC  . Abdominal hysterectomy     No family history on file. Father has hx of heart disease with MI at 17. History  Substance Use Topics  . Smoking status: Former Smoker -- 0.2 packs/day for 3 years    Types: Cigarettes    Quit date: 07/30/1990  . Smokeless tobacco: Never Used  . Alcohol Use: Yes     Comment: socially   Review of Systems  Constitutional: Positive for diaphoresis. Negative for fever, chills and fatigue.  HENT: Negative for congestion, rhinorrhea and sneezing.   Eyes: Negative.   Respiratory: Positive for shortness of breath. Negative for cough and chest tightness.   Cardiovascular: Positive for chest pain. Negative for  leg swelling.  Gastrointestinal: Positive for nausea and abdominal pain. Negative for vomiting, diarrhea and blood in stool.  Genitourinary: Negative for frequency, hematuria, flank pain and difficulty urinating.  Musculoskeletal: Positive for back pain. Negative for arthralgias.  Skin: Negative for rash.  Neurological: Negative for dizziness, speech difficulty, weakness, numbness and headaches.    Allergies  Doxycycline  Home Medications   Current Outpatient Rx  Name  Route  Sig  Dispense  Refill  . VITAMIN D 1000 UNITS PO TABS   Oral   Take 1,000 Units by mouth daily.         Marland Kitchen  DIPHENHYDRAMINE HCL 25 MG PO CAPS   Oral   Take 50 mg by mouth at bedtime.         Marland Kitchen HYDROCODONE-ACETAMINOPHEN 5-500 MG PO CAPS   Oral   Take 1 capsule by mouth every 6 (six) hours as needed. For pain         . OXYCODONE-ACETAMINOPHEN 5-325 MG PO TABS   Oral   Take 1-2 tablets by mouth every 4 (four) hours as needed for pain.   40 tablet   0   . PENTOSAN POLYSULFATE SODIUM 100 MG PO CAPS   Oral   Take 200 mg by mouth 2 (two) times daily.         . TRAMADOL HCL 50 MG PO TABS   Oral   Take 1-2 tablets (50-100 mg total) by mouth every 6 (six) hours as needed for pain.   30 tablet   0   . VITAMIN B-12 1000 MCG PO TABS   Oral   Take 1,000 mcg by mouth daily.           BP 120/81  Pulse 103  Resp 18  SpO2 100%  LMP 10/17/2010  Physical Exam  Nursing note and vitals reviewed. Constitutional: She is oriented to person, place, and time. She appears well-developed and well-nourished.  HENT:  Head: Normocephalic and atraumatic.  Eyes: Pupils are equal, round, and reactive to light.  Neck: Normal range of motion. Neck supple.  Cardiovascular: Normal rate, regular rhythm and normal heart sounds.   Pulmonary/Chest: Effort normal and breath sounds normal. No respiratory distress. She has no wheezes. She has no rales. She exhibits no tenderness.  Abdominal: Soft. Bowel sounds are normal. There is no tenderness. There is no rebound and no guarding.  Musculoskeletal: Normal range of motion. She exhibits no edema.  Lymphadenopathy:    She has no cervical adenopathy.  Neurological: She is alert and oriented to person, place, and time.  Skin: Skin is warm and dry. No rash noted.  Psychiatric: She has a normal mood and affect.    ED Course  Procedures (including critical care time) DIAGNOSTIC STUDIES: Oxygen Saturation is 100% on room air, normal by my interpretation.    COORDINATION OF CARE: 16:23: Physical exam performed 17:50: Consult placed. 17:52: Consult complete  with Dr. Gwenlyn Perking with [facility]. Patient case explained and discussed. Dr. Gwenlyn Perking agrees to transfer of patient for further evaluation and treatment at [facility].   Results for orders placed during the hospital encounter of 08/23/12  CBC WITH DIFFERENTIAL      Component Value Range   WBC 9.6  4.0 - 10.5 K/uL   RBC 4.36  3.87 - 5.11 MIL/uL   Hemoglobin 14.0  12.0 - 15.0 g/dL   HCT 78.2  95.6 - 21.3 %   MCV 94.7  78.0 - 100.0 fL   MCH 32.1  26.0 -  34.0 pg   MCHC 33.9  30.0 - 36.0 g/dL   RDW 84.1  32.4 - 40.1 %   Platelets 242  150 - 400 K/uL   Neutrophils Relative 71  43 - 77 %   Neutro Abs 6.8  1.7 - 7.7 K/uL   Lymphocytes Relative 21  12 - 46 %   Lymphs Abs 2.0  0.7 - 4.0 K/uL   Monocytes Relative 7  3 - 12 %   Monocytes Absolute 0.7  0.1 - 1.0 K/uL   Eosinophils Relative 1  0 - 5 %   Eosinophils Absolute 0.1  0.0 - 0.7 K/uL   Basophils Relative 0  0 - 1 %   Basophils Absolute 0.0  0.0 - 0.1 K/uL  COMPREHENSIVE METABOLIC PANEL      Component Value Range   Sodium 138  135 - 145 mEq/L   Potassium 3.9  3.5 - 5.1 mEq/L   Chloride 101  96 - 112 mEq/L   CO2 25  19 - 32 mEq/L   Glucose, Bld 105 (*) 70 - 99 mg/dL   BUN 18  6 - 23 mg/dL   Creatinine, Ser 0.27  0.50 - 1.10 mg/dL   Calcium 9.6  8.4 - 25.3 mg/dL   Total Protein 7.4  6.0 - 8.3 g/dL   Albumin 4.4  3.5 - 5.2 g/dL   AST 14  0 - 37 U/L   ALT 11  0 - 35 U/L   Alkaline Phosphatase 51  39 - 117 U/L   Total Bilirubin 0.2 (*) 0.3 - 1.2 mg/dL   GFR calc non Af Amer 76 (*) >90 mL/min   GFR calc Af Amer 88 (*) >90 mL/min  TROPONIN I      Component Value Range   Troponin I <0.30  <0.30 ng/mL   Dg Chest 2 View  08/23/2012  *RADIOLOGY REPORT*  Clinical Data: Chest pain  CHEST - 2 VIEW  Comparison: None.  Findings: Cardiomediastinal silhouette is unremarkable.  No acute infiltrate or pleural effusion.  No pulmonary edema.  Bony thorax is unremarkable.  IMPRESSION: No active disease.   Original Report Authenticated By:  Natasha Mead, M.D.      Date: 08/23/2012  Rate: 92  Rhythm: normal sinus rhythm  QRS Axis: normal  Intervals: normal  ST/T Wave abnormalities: normal  Conduction Disutrbances:none  Narrative Interpretation:   Old EKG Reviewed: unchanged   1. Chest pain       MDM  Pt pain free after nitro.  Dis and is an andcussed with [facility] who will accept pt for transfer to [facility].  I personally performed the services described in this documentation, which was scribed in my presence.  The recorded information has been reviewed and considered.        Rolan Bucco, MD 08/23/12 [phone]

## 2012-08-23 NOTE — Progress Notes (Signed)
Received call from [facility] to accept [name] in observation for chest pain r/o; had strong family hx for CAD. Came to ED with CP atypical with typical features. Patient to telemetry bed under team [id number].  Katie Rice [phone]

## 2012-08-24 DIAGNOSIS — R0789 Other chest pain: Secondary | ICD-10-CM

## 2012-08-24 DIAGNOSIS — R0602 Shortness of breath: Secondary | ICD-10-CM

## 2012-08-24 DIAGNOSIS — R079 Chest pain, unspecified: Secondary | ICD-10-CM

## 2012-08-24 LAB — LIPID PANEL
Total CHOL/HDL Ratio: 2.9 RATIO
VLDL: 17 mg/dL (ref 0–40)

## 2012-08-24 LAB — CBC
Hemoglobin: 12.9 g/dL (ref 12.0–15.0)
MCH: 32.2 pg (ref 26.0–34.0)
MCHC: 33.8 g/dL (ref 30.0–36.0)
RDW: 12.5 % (ref 11.5–15.5)

## 2012-08-24 LAB — TROPONIN I
Troponin I: <0.3 ng/mL (ref <0.30)
Troponin I: <0.3 ng/mL (ref <0.30)

## 2012-08-24 LAB — BASIC METABOLIC PANEL
BUN: 11 mg/dL (ref 6–23)
Chloride: 106 mEq/L (ref 96–112)
Creatinine, Ser: 0.72 mg/dL (ref 0.50–1.10)
GFR calc non Af Amer: >90 mL/min (ref >90)
Glucose, Bld: 93 mg/dL (ref 70–99)
Sodium: 140 mEq/L (ref 135–145)

## 2012-08-24 MED ORDER — OXYCODONE HCL 5 MG PO TABS: 5.0000 mg | ORAL_TABLET | ORAL | Status: DC | PRN

## 2012-08-24 MED ORDER — MORPHINE SULFATE 2 MG/ML IJ SOLN: 2.0000 mg | Freq: Once | INTRAMUSCULAR | Status: DC

## 2012-08-24 MED ORDER — KETOROLAC TROMETHAMINE 30 MG/ML IJ SOLN
30.0000 mg | Freq: Once | INTRAMUSCULAR | Status: AC
  Administered 2012-08-24: 30 mg via INTRAVENOUS
  Filled 2012-08-24: qty 1

## 2012-08-24 NOTE — Progress Notes (Signed)
UR Completed Randal Yepiz Graves-Bigelow, RN,BSN [phone]  

## 2012-08-24 NOTE — Progress Notes (Signed)
Patient states that she is having recurrent chest pain this morning with mild dyspnea and "swelling" feeling in left side of neck. She was awake and laying in bed when it started. Pt was examined at this time and chart reviewed. Pt in NAD, lungs CTA, heart RRR. No chest tenderness. Repeat EKG and troponin ordered as well as toradol for her pain as SBP was in 90s. No nitro given as it gave patient severe headache.

## 2012-08-24 NOTE — Care Management Note (Unsigned)
    Page 1 of 1   08/24/2012     8:20:29 AM   CARE MANAGEMENT NOTE 08/24/2012  Patient:  Katie Rice, Katie Rice   Account Number:  [id number]  Date Initiated:  08/24/2012  Documentation initiated by:  GRAVES-BIGELOW,Suki Crockett  Subjective/Objective Assessment:   Pt admitted with cp. Family at bedside.     Action/Plan:   CM will continue to monitor for disposition needs.   Anticipated DC Date:  08/25/2012   Anticipated DC Plan:  HOME/SELF CARE      DC Planning Services  CM consult      Choice offered to / List presented to:             Status of service:  In process, will continue to follow Medicare Important Message given?   (If response is "NO", the following Medicare IM given date fields will be blank) Date Medicare IM given:   Date Additional Medicare IM given:    Discharge Disposition:    Per UR Regulation:  Reviewed for med. necessity/level of care/duration of stay  If discussed at Long Length of Stay Meetings, dates discussed:    Comments:

## 2012-08-24 NOTE — Discharge Summary (Signed)
Physician Discharge Summary  Katie Rice WUJ:[id number] DOB: October 07, 1965 DOA: 08/23/2012  PCP: Aura Dials, MD  Admit date: 08/23/2012 Discharge date: 08/24/2012  Time spent: 35 minutes  Recommendations for Outpatient Follow-up:  1. Follow up with Cardiologist as an outpatient for stress test  Discharge Diagnoses:  Principal Problem:  *Atypical chest pain Active Problems:  SOB (shortness of breath)   Discharge Condition: stable  Diet recommendation: regualr  [facility] Weights   08/23/12 2009 08/24/12 0619  Weight: 63.64 kg (140 lb 4.8 oz) 63.957 kg (141 lb)    History of present illness:  47 y.o. Female with PMH of Cervical spondylosis and ruptured disk at cervical 5-6 with bilateral C6 radiculopathy  who presented to the [facility] ED with complaints of intermittent left sided chest pain x 2 days. Her pain started in her back the day before and the next AM, the pain was in the left Chest and lower ABD and radiated into her left arm. She reports having SOB, and nausea and diaphoresis associated with the pain. The pain was described as a tightness and pressure in the chest , and the pain today was not relieved until she receive a NTG sl X 1, but returned again , and the NTG was repeated , and then she had to be given Morphine once. Her cardiac workup was negative initially and she was referred to [facility] for further evaluation.    Hospital Course:  Principal Problem: Atypical chest pain/ SOB (shortness of breath) - She was admitted  to telemetry unit, no events, cardiac enzymes negative x3. EKG sinus rhythm, normal axis no t wave abnormalities.  - d-dimer < 0.3. CXR no infiltrate or widening mediastinum. - will follow up with Dr. Lennox Solders as an outpatient  Procedures:  none (i.e. Studies not automatically included, echos, thoracentesis, etc; not x-rays)  Consultations:  none  Discharge Exam: Filed Vitals:   08/23/12 2327 08/24/12 0619 08/24/12 0630 08/24/12 0905  BP: 103/68  96/52 92/54 102/64  Pulse:  94 86   Temp: 98 F (36.7 C) 98.5 F (36.9 C) 98.1 F (36.7 C)   TempSrc:  Oral Oral   Resp: 19 18 19    Height:      Weight:  63.957 kg (141 lb)    SpO2: 99% 98% 99%     General: A&O x3 Cardiovascular: RRR Respiratory: good air movement CTA B/L  Discharge Instructions  Discharge Orders    Future Orders Please Complete By Expires   Diet - low sodium heart healthy      Increase activity slowly          Medication List     As of 08/24/2012 11:11 AM    TAKE these medications         diphenhydrAMINE 25 mg capsule   Commonly known as: BENADRYL   Take 50 mg by mouth at bedtime.      oxyCODONE 5 MG immediate release tablet   Commonly known as: Oxy IR/ROXICODONE   Take 1 tablet (5 mg total) by mouth every 4 (four) hours as needed.      pentosan polysulfate 100 MG capsule   Commonly known as: ELMIRON   Take 200 mg by mouth at bedtime.           Follow-up Information    Follow up with Pamella Pert, MD. In 2 weeks. (hospital follow up)    Contact information:   [address] [place] [address] [phone]  The results of significant diagnostics from this hospitalization (including imaging, microbiology, ancillary and laboratory) are listed below for reference.    Significant Diagnostic Studies: Dg Chest 2 View  08/23/2012  *RADIOLOGY REPORT*  Clinical Data: Chest pain  CHEST - 2 VIEW  Comparison: None.  Findings: Cardiomediastinal silhouette is unremarkable.  No acute infiltrate or pleural effusion.  No pulmonary edema.  Bony thorax is unremarkable.  IMPRESSION: No active disease.   Original Report Authenticated By: Natasha Mead, M.D.     Microbiology: No results found for this or any previous visit (from the past 240 hour(s)).   Labs: Basic Metabolic Panel:  Lab 08/24/12 1610 08/23/12 1624  NA 140 138  K 4.0 3.9  CL 106 101  CO2 28 25  GLUCOSE 93 105*  BUN 11 18  CREATININE 0.72 0.90  CALCIUM  8.8 9.6  MG -- --  PHOS -- --   Liver Function Tests:  Lab 08/23/12 1624  AST 14  ALT 11  ALKPHOS 51  BILITOT 0.2*  PROT 7.4  ALBUMIN 4.4   No results found for this basename: LIPASE:5,AMYLASE:5 in the last 168 hours No results found for this basename: AMMONIA:5 in the last 168 hours CBC:  Lab 08/24/12 0615 08/23/12 1624  WBC 7.1 9.6  NEUTROABS -- 6.8  HGB 12.9 14.0  HCT 38.2 41.3  MCV 95.3 94.7  PLT 202 242   Cardiac Enzymes:  Lab 08/24/12 0815 08/23/12 1624  CKTOTAL -- --  CKMB -- --  CKMBINDEX -- --  TROPONINI <0.30 <0.30   BNP: BNP (last 3 results) No results found for this basename: PROBNP:3 in the last 8760 hours CBG: No results found for this basename: GLUCAP:5 in the last 168 hours     Signed:  Marinda Elk  [facility] 08/24/2012, 11:11 AM

## 2012-08-24 NOTE — Progress Notes (Signed)
Patient c/o of chest pain rating it a 6/10 with a tingling sensation radiating down her left arm. Patient also c/o headache at a 7. Patient says she feels like there is a lump in her throat making it difficult to breathe. EKG obtained resulting in NSR. Vitals T 98.1 P 86 R19 BP 92/54 02 99 RA. Patient placed on 2L N/C. PA On call made aware, and came to see patient. 30 mg of IV Toradol ordered and administered. 2mL of IV zofran given for nausea. Patient stated that the Toradol did decrease her discomfort overall to a 5. Patient resting comfortably. Will continue to monitor patient.

## 2012-08-25 ENCOUNTER — Ambulatory Visit
Admission: RE | Admit: 2012-08-25 | Discharge: 2012-08-25 | Disposition: A | Discharge status: Home / Self Care | Payer: 59 | Source: Ambulatory Visit | Attending: Nurse Practitioner | Admitting: Nurse Practitioner

## 2012-08-25 ENCOUNTER — Other Ambulatory Visit: Payer: Self-pay | Admitting: Nurse Practitioner

## 2012-08-25 DIAGNOSIS — R51 Headache: Secondary | ICD-10-CM

## 2012-09-03 ENCOUNTER — Other Ambulatory Visit: Payer: Self-pay | Admitting: Family Medicine

## 2012-09-03 DIAGNOSIS — R109 Unspecified abdominal pain: Secondary | ICD-10-CM

## 2012-09-04 ENCOUNTER — Ambulatory Visit
Admission: RE | Admit: 2012-09-04 | Discharge: 2012-09-04 | Disposition: A | Discharge status: Home / Self Care | Payer: Managed Care, Other (non HMO) | Source: Ambulatory Visit | Attending: Family Medicine | Admitting: Family Medicine

## 2012-09-04 DIAGNOSIS — R109 Unspecified abdominal pain: Secondary | ICD-10-CM

## 2012-09-04 MED ORDER — IOHEXOL 300 MG/ML  SOLN
100.0000 mL | Freq: Once | INTRAMUSCULAR | Status: AC | PRN
  Administered 2012-09-04: 100 mL via INTRAVENOUS

## 2012-09-13 ENCOUNTER — Other Ambulatory Visit: Payer: Self-pay

## 2012-10-17 ENCOUNTER — Other Ambulatory Visit: Payer: Self-pay | Admitting: Neurosurgery

## 2012-10-17 DIAGNOSIS — M542 Cervicalgia: Secondary | ICD-10-CM

## 2012-10-24 ENCOUNTER — Ambulatory Visit
Admission: RE | Admit: 2012-10-24 | Discharge: 2012-10-24 | Disposition: A | Discharge status: Home / Self Care | Payer: Managed Care, Other (non HMO) | Source: Ambulatory Visit | Attending: Neurosurgery | Admitting: Neurosurgery

## 2012-10-24 VITALS — BP 109/77 | HR 90

## 2012-10-24 DIAGNOSIS — M542 Cervicalgia: Secondary | ICD-10-CM

## 2012-10-24 MED ORDER — DIAZEPAM 5 MG PO TABS
10.0000 mg | ORAL_TABLET | Freq: Once | ORAL | Status: AC
  Administered 2012-10-24: 10 mg via ORAL

## 2012-10-24 MED ORDER — ONDANSETRON HCL 4 MG/2ML IJ SOLN
4.0000 mg | Freq: Once | INTRAMUSCULAR | Status: AC
  Administered 2012-10-24: 4 mg via INTRAMUSCULAR

## 2012-10-24 MED ORDER — HYDROMORPHONE HCL PF 1 MG/ML IJ SOLN
1.0000 mg | Freq: Once | INTRAMUSCULAR | Status: AC
  Administered 2012-10-24: 1 mg via INTRAMUSCULAR

## 2012-10-24 MED ORDER — ONDANSETRON HCL 4 MG/2ML IJ SOLN: 4.0000 mg | Freq: Once | INTRAMUSCULAR | Status: AC

## 2012-10-24 MED ORDER — HYDROMORPHONE HCL PF 2 MG/ML IJ SOLN
2.0000 mg | Freq: Once | INTRAMUSCULAR | Status: AC
  Administered 2012-10-24: 1 mg via INTRAMUSCULAR

## 2012-10-24 MED ORDER — IOHEXOL 300 MG/ML  SOLN
10.0000 mL | Freq: Once | INTRAMUSCULAR | Status: AC | PRN
  Administered 2012-10-24: 10 mL via INTRATHECAL

## 2012-10-24 NOTE — Progress Notes (Signed)
Patient with c/o severe headache after contrast injected during myelogram.  HOB elevated 45 degrees for ten minutes and ice packs applied to back of neck and right forehead.  After the ten minutes, HOB flat again.  Patient medicated again for headache.  States she is feeling better at this point.  Donell Sievert, RN

## 2012-10-24 NOTE — Progress Notes (Signed)
Patient resting quietly on stretcher with husband at bedside.  Medicated again for right-sided headache which is "better but it still hurts."  jkl

## 2012-10-24 NOTE — Progress Notes (Signed)
Dr. Alfredo Batty in to check on and speak with patient and her husband prior to discharge.  Patient's headache "better" but still present.  jkl

## 2012-10-27 ENCOUNTER — Telehealth: Payer: Self-pay | Admitting: Radiology

## 2012-10-27 ENCOUNTER — Other Ambulatory Visit: Payer: Self-pay | Admitting: Neurosurgery

## 2012-10-27 ENCOUNTER — Ambulatory Visit
Admission: RE | Admit: 2012-10-27 | Discharge: 2012-10-27 | Disposition: A | Discharge status: Home / Self Care | Payer: Managed Care, Other (non HMO) | Source: Ambulatory Visit | Attending: Neurosurgery | Admitting: Neurosurgery

## 2012-10-27 DIAGNOSIS — G971 Other reaction to spinal and lumbar puncture: Secondary | ICD-10-CM

## 2012-10-27 MED ORDER — IOHEXOL 180 MG/ML  SOLN
1.0000 mL | Freq: Once | INTRAMUSCULAR | Status: AC | PRN
  Administered 2012-10-27: 1 mL via EPIDURAL

## 2012-10-27 NOTE — Progress Notes (Signed)
20cc blood drawn from left [facility] space for procedure without difficulty; site unremarkable.  jkl

## 2012-10-27 NOTE — Telephone Encounter (Signed)
Pt has had headache since Friday when myelo was done, she spoke to Dr. Alfredo Batty on Sunday and was told we would do blood patch today. Explained to pt that I would call Dr. Lonie Peak office to get the order, but they would not open for a while. Also, told her I would call with time once order received.

## 2012-11-10 ENCOUNTER — Ambulatory Visit (INDEPENDENT_AMBULATORY_CARE_PROVIDER_SITE_OTHER): Payer: Managed Care, Other (non HMO) | Admitting: Internal Medicine

## 2012-11-10 ENCOUNTER — Encounter: Payer: Self-pay | Admitting: Internal Medicine

## 2012-11-10 VITALS — BP 110/68 | HR 70 | Temp 98.3°F | Ht 66.0 in | Wt 144.0 lb

## 2012-11-10 DIAGNOSIS — R0789 Other chest pain: Secondary | ICD-10-CM

## 2012-11-10 DIAGNOSIS — R911 Solitary pulmonary nodule: Secondary | ICD-10-CM | POA: Insufficient documentation

## 2012-11-10 NOTE — Progress Notes (Signed)
  Subjective:    Patient ID: Katie Rice, female    DOB: [birth year]  MRN: [id number]  HPI  47 yowf with minimal smoking stopped in 1992 moved to [facility] 1998 from [place] referred 11/10/2012 to pulmonary clinic by Dr Wynetta Emery for eval of incidentally noted LUL nodule.   11/10/2012 1st pulmonary cc sev years of fleeting position stereotypical anterior cp on L rad to Post chest and hurts to breath.      Review of Systems  Constitutional: Negative for fever, chills and unexpected weight change.  HENT: Negative for ear pain, nosebleeds, congestion, sore throat, rhinorrhea, sneezing, trouble swallowing, dental problem, voice change, postnasal drip and sinus pressure.   Eyes: Negative for visual disturbance.  Respiratory: Negative for cough, choking and shortness of breath.   Cardiovascular: Positive for chest pain. Negative for leg swelling.  Gastrointestinal: Negative for vomiting, abdominal pain and diarrhea.  Genitourinary: Negative for difficulty urinating.  Musculoskeletal: Negative for arthralgias.  Skin: Negative for rash.  Neurological: Negative for tremors, syncope and headaches.  Hematological: Does not bruise/bleed easily.       Objective:   Physical Exam Wt Readings from Last 3 Encounters:  11/10/12 144 lb (65.318 kg)  08/24/12 141 lb (63.957 kg)  05/15/12 140 lb (63.504 kg)    HEENT: nl dentition, turbinates, and orophanx. Nl external ear canals without cough reflex   NECK :  without JVD/Nodes/TM/ nl carotid upstrokes bilaterally   LUNGS: no acc muscle use, clear to A and P bilaterally without cough on insp or exp maneuvers   CV:  RRR  no s3 or murmur or increase in P2, no edema   ABD:  soft and nontender with nl excursion in the supine position. No bruits or organomegaly, bowel sounds nl  MS:  warm without deformities, calf tenderness, cyanosis or clubbing  SKIN: warm and dry without lesions    NEURO:  alert, approp, no deficits   Ct chest 10/24/12  with spiculated nodule with central calcification with no priors     Assessment & Plan:

## 2012-11-10 NOTE — Patient Instructions (Addendum)
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

## 2012-11-10 NOTE — Assessment & Plan Note (Signed)
-   h/o heavy exp to [place] See CT neck 10/24/12  Not a classic rounded granuloma but enough calcium to make it most likely some form of a histoplasmoma in this young pt with minimal smoking hx  Discussed in detail all the  indications, usual  risks and alternatives  relative to the benefits with patient who agrees to proceed with conservative f/u in 6 months with PET scanning not ideal given ddx from granuloma in such a small lesion vs excisional bx if shows growth x 6 months and she agreed to the latter approach.

## 2012-11-10 NOTE — Assessment & Plan Note (Signed)

## 2012-11-11 ENCOUNTER — Institutional Professional Consult (permissible substitution): Payer: Managed Care, Other (non HMO) | Admitting: Internal Medicine

## 2013-02-19 ENCOUNTER — Other Ambulatory Visit: Payer: Self-pay | Admitting: Neurosurgery

## 2013-02-26 ENCOUNTER — Encounter (HOSPITAL_COMMUNITY): Payer: Self-pay | Admitting: Pharmacy Technician

## 2013-03-03 NOTE — Pre-Procedure Instructions (Addendum)
Katie Rice  03/03/2013   Your procedure is scheduled on: Wednesday, August 13th.  Report to [facility] at 10:45AM.  Call this number if you have problems the morning of surgery: [phone]   Remember:   Do not eat food or drink liquids after midnight.   Take these medicines the morning of surgery with A SIP OF WATER: If needed:cyclobenzaprine (FLEXERIL). May use fluticasone (FLONASE).    Do not wear jewelry, make-up or nail polish.  Do not wear lotions, powders, or perfumes. You may wear deodorant.  Do not shave 48 hours prior to surgery.   Do not bring valuables to the hospital.  [facility] is not responsible for any belongings or valuables.  Contacts, dentures or bridgework may not be worn into surgery.  Leave suitcase in the car. After surgery it may be brought to your room.  For patients admitted to the hospital, checkout time is 11:00 AM the day of discharge.   Patients discharged the day of surgery will not be allowed to drive home.  Name and phone number of your driver: -   Special Instructions: Shower using CHG 2 nights before surgery and the night before surgery.  If you shower the day of surgery use CHG.  Use special wash - you have one bottle of CHG for all showers.  You should use approximately 1/3 of the bottle for each shower.   Please read over the following fact sheets that you were given: Pain Booklet, Coughing and Deep Breathing and Surgical Site Infection Prevention

## 2013-03-04 ENCOUNTER — Encounter (HOSPITAL_COMMUNITY)
Admission: RE | Admit: 2013-03-04 | Discharge: 2013-03-04 | Disposition: A | Discharge status: Home / Self Care | Payer: Managed Care, Other (non HMO) | Source: Ambulatory Visit | Attending: Neurosurgery | Admitting: Neurosurgery

## 2013-03-04 ENCOUNTER — Encounter (HOSPITAL_COMMUNITY): Payer: Self-pay

## 2013-03-04 DIAGNOSIS — Z01818 Encounter for other preprocedural examination: Secondary | ICD-10-CM | POA: Insufficient documentation

## 2013-03-04 DIAGNOSIS — Z01812 Encounter for preprocedural laboratory examination: Secondary | ICD-10-CM | POA: Insufficient documentation

## 2013-03-04 HISTORY — DX: Interstitial cystitis (chronic) without hematuria: N30.10

## 2013-03-04 LAB — BASIC METABOLIC PANEL
BUN: 13 mg/dL (ref 6–23)
Chloride: 102 mEq/L (ref 96–112)
GFR calc Af Amer: >90 mL/min (ref >90)
GFR calc non Af Amer: >90 mL/min (ref >90)
Potassium: 4 mEq/L (ref 3.5–5.1)

## 2013-03-04 LAB — CBC
HCT: 42.3 % (ref 36.0–46.0)
Hemoglobin: 14.5 g/dL (ref 12.0–15.0)
MCHC: 34.3 g/dL (ref 30.0–36.0)
RDW: 12.7 % (ref 11.5–15.5)
WBC: 5.5 10*3/uL (ref 4.0–10.5)

## 2013-03-04 LAB — SURGICAL PCR SCREEN
MRSA, PCR: NEGATIVE
Staphylococcus aureus: POSITIVE — AB

## 2013-03-04 NOTE — Progress Notes (Signed)
ekg 1/14 cxr 1/14, echo 5/10

## 2013-03-10 MED ORDER — CEFAZOLIN SODIUM-DEXTROSE 2-3 GM-% IV SOLR
2.0000 g | INTRAVENOUS | Status: AC
  Administered 2013-03-11: 2 g via INTRAVENOUS
  Filled 2013-03-10: qty 50

## 2013-03-11 ENCOUNTER — Inpatient Hospital Stay (HOSPITAL_COMMUNITY)
Admission: RE | Admit: 2013-03-11 | Discharge: 2013-03-12 | DRG: 473 | Disposition: A | Discharge status: Home / Self Care | Payer: Managed Care, Other (non HMO) | Source: Ambulatory Visit | Attending: Neurosurgery | Admitting: Neurosurgery

## 2013-03-11 ENCOUNTER — Encounter (HOSPITAL_COMMUNITY): Payer: Self-pay | Admitting: Anesthesiology

## 2013-03-11 ENCOUNTER — Inpatient Hospital Stay (HOSPITAL_COMMUNITY): Payer: Managed Care, Other (non HMO) | Admitting: Anesthesiology

## 2013-03-11 ENCOUNTER — Encounter (HOSPITAL_COMMUNITY)
Admission: RE | Disposition: A | Discharge status: Home / Self Care | Payer: Self-pay | Source: Ambulatory Visit | Attending: Neurosurgery

## 2013-03-11 ENCOUNTER — Inpatient Hospital Stay (HOSPITAL_COMMUNITY): Payer: Managed Care, Other (non HMO)

## 2013-03-11 DIAGNOSIS — Z79899 Other long term (current) drug therapy: Secondary | ICD-10-CM

## 2013-03-11 DIAGNOSIS — T84498A Other mechanical complication of other internal orthopedic devices, implants and grafts, initial encounter: Principal | ICD-10-CM | POA: Diagnosis present

## 2013-03-11 DIAGNOSIS — M47812 Spondylosis without myelopathy or radiculopathy, cervical region: Secondary | ICD-10-CM | POA: Diagnosis present

## 2013-03-11 DIAGNOSIS — N3010 Interstitial cystitis (chronic) without hematuria: Secondary | ICD-10-CM | POA: Diagnosis present

## 2013-03-11 DIAGNOSIS — Z87891 Personal history of nicotine dependence: Secondary | ICD-10-CM

## 2013-03-11 DIAGNOSIS — Y831 Surgical operation with implant of artificial internal device as the cause of abnormal reaction of the patient, or of later complication, without mention of misadventure at the time of the procedure: Secondary | ICD-10-CM | POA: Diagnosis present

## 2013-03-11 HISTORY — PX: ANTERIOR CERVICAL DECOMPRESSION/DISCECTOMY FUSION 4 LEVELS: SHX5556

## 2013-03-11 SURGERY — ANTERIOR CERVICAL DECOMPRESSION/DISCECTOMY FUSION 4 LEVELS
Anesthesia: General | Site: Neck | Wound class: Clean

## 2013-03-11 MED ORDER — ACETAMINOPHEN 650 MG RE SUPP: 650.0000 mg | RECTAL | Status: DC | PRN

## 2013-03-11 MED ORDER — SODIUM CHLORIDE 0.9 % IJ SOLN
3.0000 mL | Freq: Two times a day (BID) | INTRAMUSCULAR | Status: DC
  Administered 2013-03-11: 3 mL via INTRAVENOUS

## 2013-03-11 MED ORDER — GLYCOPYRROLATE 0.2 MG/ML IJ SOLN
INTRAMUSCULAR | Status: DC | PRN
  Administered 2013-03-11: 0.6 mg via INTRAVENOUS

## 2013-03-11 MED ORDER — DEXAMETHASONE SODIUM PHOSPHATE 4 MG/ML IJ SOLN
4.0000 mg | Freq: Four times a day (QID) | INTRAMUSCULAR | Status: DC
  Filled 2013-03-11 (×4): qty 1

## 2013-03-11 MED ORDER — HYDROMORPHONE HCL PF 1 MG/ML IJ SOLN
0.2500 mg | INTRAMUSCULAR | Status: DC | PRN
  Administered 2013-03-11 (×2): 0.5 mg via INTRAVENOUS

## 2013-03-11 MED ORDER — LACTATED RINGERS IV SOLN
INTRAVENOUS | Status: DC
  Administered 2013-03-11: 11:00:00 via INTRAVENOUS

## 2013-03-11 MED ORDER — HYDROMORPHONE HCL PF 1 MG/ML IJ SOLN
INTRAMUSCULAR | Status: AC
  Administered 2013-03-11: 0.5 mg via INTRAVENOUS
  Filled 2013-03-11: qty 1

## 2013-03-11 MED ORDER — DEXAMETHASONE 4 MG PO TABS
4.0000 mg | ORAL_TABLET | Freq: Four times a day (QID) | ORAL | Status: DC
  Administered 2013-03-12 (×2): 4 mg via ORAL
  Filled 2013-03-11 (×6): qty 1

## 2013-03-11 MED ORDER — ONDANSETRON HCL 4 MG/2ML IJ SOLN
INTRAMUSCULAR | Status: DC | PRN
  Administered 2013-03-11: 4 mg via INTRAVENOUS

## 2013-03-11 MED ORDER — AMITRIPTYLINE HCL 25 MG PO TABS
25.0000 mg | ORAL_TABLET | Freq: Every evening | ORAL | Status: DC | PRN
  Filled 2013-03-11: qty 1

## 2013-03-11 MED ORDER — SODIUM CHLORIDE 0.9 % IJ SOLN: 3.0000 mL | INTRAMUSCULAR | Status: DC | PRN

## 2013-03-11 MED ORDER — DEXAMETHASONE SODIUM PHOSPHATE 10 MG/ML IJ SOLN: 10.0000 mg | Freq: Once | INTRAMUSCULAR | Status: AC

## 2013-03-11 MED ORDER — THROMBIN 20000 UNITS EX SOLR
CUTANEOUS | Status: DC | PRN
  Administered 2013-03-11: 13:00:00 via TOPICAL

## 2013-03-11 MED ORDER — 0.9 % SODIUM CHLORIDE (POUR BTL) OPTIME
TOPICAL | Status: DC | PRN
  Administered 2013-03-11: 1000 mL

## 2013-03-11 MED ORDER — HYDROCODONE-ACETAMINOPHEN 5-325 MG PO TABS: 1.0000 | ORAL_TABLET | Freq: Four times a day (QID) | ORAL | Status: DC | PRN

## 2013-03-11 MED ORDER — SODIUM CHLORIDE 0.9 % IR SOLN
Status: DC | PRN
  Administered 2013-03-11: 13:00:00

## 2013-03-11 MED ORDER — ALUM & MAG HYDROXIDE-SIMETH 200-200-20 MG/5ML PO SUSP: 30.0000 mL | Freq: Four times a day (QID) | ORAL | Status: DC | PRN

## 2013-03-11 MED ORDER — NEOSTIGMINE METHYLSULFATE 1 MG/ML IJ SOLN
INTRAMUSCULAR | Status: DC | PRN
  Administered 2013-03-11: 3 mg via INTRAVENOUS

## 2013-03-11 MED ORDER — OXYCODONE HCL 5 MG PO TABS: 5.0000 mg | ORAL_TABLET | Freq: Once | ORAL | Status: AC | PRN

## 2013-03-11 MED ORDER — URELLE 81 MG PO TABS
1.0000 | ORAL_TABLET | Freq: Four times a day (QID) | ORAL | Status: DC | PRN
  Filled 2013-03-11: qty 1

## 2013-03-11 MED ORDER — LACTATED RINGERS IV SOLN
INTRAVENOUS | Status: DC | PRN
  Administered 2013-03-11 (×3): via INTRAVENOUS

## 2013-03-11 MED ORDER — PENTOSAN POLYSULFATE SODIUM 100 MG PO CAPS
200.0000 mg | ORAL_CAPSULE | Freq: Every day | ORAL | Status: DC
  Administered 2013-03-11: 200 mg via ORAL
  Filled 2013-03-11 (×2): qty 2

## 2013-03-11 MED ORDER — DIAZEPAM 5 MG/ML IJ SOLN: 2.0000 mg | Freq: Once | INTRAMUSCULAR | Status: AC

## 2013-03-11 MED ORDER — HYDROMORPHONE HCL PF 1 MG/ML IJ SOLN
0.5000 mg | INTRAMUSCULAR | Status: DC | PRN
  Administered 2013-03-11 – 2013-03-12 (×3): 1 mg via INTRAVENOUS
  Filled 2013-03-11 (×3): qty 1

## 2013-03-11 MED ORDER — CEFAZOLIN SODIUM 1-5 GM-% IV SOLN
1.0000 g | Freq: Three times a day (TID) | INTRAVENOUS | Status: AC
  Administered 2013-03-11 – 2013-03-12 (×2): 1 g via INTRAVENOUS
  Filled 2013-03-11 (×2): qty 50

## 2013-03-11 MED ORDER — CYCLOBENZAPRINE HCL 10 MG PO TABS
10.0000 mg | ORAL_TABLET | ORAL | Status: AC
  Administered 2013-03-11: 10 mg via ORAL
  Filled 2013-03-11: qty 1

## 2013-03-11 MED ORDER — OXYCODONE-ACETAMINOPHEN 5-325 MG PO TABS
1.0000 | ORAL_TABLET | ORAL | Status: DC | PRN
  Administered 2013-03-12: 2 via ORAL
  Filled 2013-03-11: qty 2

## 2013-03-11 MED ORDER — CYCLOBENZAPRINE HCL 10 MG PO TABS: 10.0000 mg | ORAL_TABLET | Freq: Three times a day (TID) | ORAL | Status: DC | PRN

## 2013-03-11 MED ORDER — FENTANYL CITRATE 0.05 MG/ML IJ SOLN
INTRAMUSCULAR | Status: DC | PRN
  Administered 2013-03-11 (×3): 50 ug via INTRAVENOUS
  Administered 2013-03-11: 100 ug via INTRAVENOUS

## 2013-03-11 MED ORDER — ONDANSETRON HCL 4 MG/2ML IJ SOLN: 4.0000 mg | INTRAMUSCULAR | Status: DC | PRN

## 2013-03-11 MED ORDER — FLUTICASONE PROPIONATE 50 MCG/ACT NA SUSP: 2.0000 | Freq: Every day | NASAL | Status: DC | PRN

## 2013-03-11 MED ORDER — OXYCODONE HCL 5 MG PO TABS
ORAL_TABLET | ORAL | Status: AC
  Administered 2013-03-11: 5 mg via ORAL
  Filled 2013-03-11: qty 1

## 2013-03-11 MED ORDER — DEXAMETHASONE SODIUM PHOSPHATE 10 MG/ML IJ SOLN
10.0000 mg | INTRAMUSCULAR | Status: AC
  Administered 2013-03-11: 10 mg via INTRAVENOUS

## 2013-03-11 MED ORDER — ROCURONIUM BROMIDE 100 MG/10ML IV SOLN
INTRAVENOUS | Status: DC | PRN
  Administered 2013-03-11 (×3): 10 mg via INTRAVENOUS
  Administered 2013-03-11: 50 mg via INTRAVENOUS
  Administered 2013-03-11: 10 mg via INTRAVENOUS

## 2013-03-11 MED ORDER — SODIUM CHLORIDE 0.9 % IV SOLN
INTRAVENOUS | Status: AC
  Filled 2013-03-11: qty 500

## 2013-03-11 MED ORDER — DOCUSATE SODIUM 100 MG PO CAPS
100.0000 mg | ORAL_CAPSULE | Freq: Two times a day (BID) | ORAL | Status: DC
  Administered 2013-03-11 – 2013-03-12 (×2): 100 mg via ORAL
  Filled 2013-03-11 (×2): qty 1

## 2013-03-11 MED ORDER — PHENYLEPHRINE HCL 10 MG/ML IJ SOLN
INTRAMUSCULAR | Status: DC | PRN
  Administered 2013-03-11 (×2): 80 ug via INTRAVENOUS

## 2013-03-11 MED ORDER — CYCLOBENZAPRINE HCL 10 MG PO TABS
10.0000 mg | ORAL_TABLET | Freq: Three times a day (TID) | ORAL | Status: DC | PRN
  Administered 2013-03-11 – 2013-03-12 (×2): 10 mg via ORAL
  Filled 2013-03-11 (×3): qty 1

## 2013-03-11 MED ORDER — DIPHENHYDRAMINE HCL 50 MG PO CAPS
50.0000 mg | ORAL_CAPSULE | Freq: Every day | ORAL | Status: DC
  Administered 2013-03-11: 50 mg via ORAL
  Filled 2013-03-11 (×2): qty 1

## 2013-03-11 MED ORDER — HYDROMORPHONE HCL PF 1 MG/ML IJ SOLN
0.5000 mg | INTRAMUSCULAR | Status: DC | PRN
  Administered 2013-03-11: 0.5 mg via INTRAVENOUS

## 2013-03-11 MED ORDER — BACITRACIN 50000 UNITS IM SOLR
INTRAMUSCULAR | Status: AC
  Filled 2013-03-11: qty 1

## 2013-03-11 MED ORDER — DEXAMETHASONE SODIUM PHOSPHATE 10 MG/ML IJ SOLN
INTRAMUSCULAR | Status: AC
  Administered 2013-03-11: 10 mg via INTRAVENOUS
  Filled 2013-03-11: qty 1

## 2013-03-11 MED ORDER — PHENOL 1.4 % MT LIQD: 1.0000 | OROMUCOSAL | Status: DC | PRN

## 2013-03-11 MED ORDER — DEXAMETHASONE SODIUM PHOSPHATE 10 MG/ML IJ SOLN
INTRAMUSCULAR | Status: AC
  Filled 2013-03-11: qty 1

## 2013-03-11 MED ORDER — DIAZEPAM 5 MG/ML IJ SOLN
INTRAMUSCULAR | Status: AC
  Administered 2013-03-11: 2 mg via INTRAVENOUS
  Filled 2013-03-11: qty 2

## 2013-03-11 MED ORDER — MENTHOL 3 MG MT LOZG
1.0000 | LOZENGE | OROMUCOSAL | Status: DC | PRN
  Filled 2013-03-11: qty 9

## 2013-03-11 MED ORDER — PROPOFOL 10 MG/ML IV BOLUS
INTRAVENOUS | Status: DC | PRN
  Administered 2013-03-11: 200 mg via INTRAVENOUS

## 2013-03-11 MED ORDER — SODIUM CHLORIDE 0.9 % IV SOLN: 250.0000 mL | INTRAVENOUS | Status: DC

## 2013-03-11 MED ORDER — THROMBIN 5000 UNITS EX SOLR
OROMUCOSAL | Status: DC | PRN
  Administered 2013-03-11 (×2): via TOPICAL

## 2013-03-11 MED ORDER — PROMETHAZINE HCL 25 MG/ML IJ SOLN: 6.2500 mg | INTRAMUSCULAR | Status: DC | PRN

## 2013-03-11 MED ORDER — ACETAMINOPHEN 325 MG PO TABS: 650.0000 mg | ORAL_TABLET | ORAL | Status: DC | PRN

## 2013-03-11 MED ORDER — OXYCODONE HCL 5 MG/5ML PO SOLN: 5.0000 mg | Freq: Once | ORAL | Status: AC | PRN

## 2013-03-11 SURGICAL SUPPLY — 84 items
ADH SKN CLS APL DERMABOND .7 (GAUZE/BANDAGES/DRESSINGS) ×1
APL SKNCLS STERI-STRIP NONHPOA (GAUZE/BANDAGES/DRESSINGS) ×1
BAG DECANTER FOR FLEXI CONT (MISCELLANEOUS) ×2 IMPLANT
BENZOIN TINCTURE PRP APPL 2/3 (GAUZE/BANDAGES/DRESSINGS) ×2 IMPLANT
BIT DRILL 13 (BIT) ×1 IMPLANT
BONE CERV LORDOTIC 14.5X12X7 (Bone Implant) ×6 IMPLANT
BONE CERV LORDOTIC 14.5X12X8 (Bone Implant) ×2 IMPLANT
BRUSH SCRUB EZ PLAIN DRY (MISCELLANEOUS) ×2 IMPLANT
BUR MATCHSTICK NEURO 3.0 LAGG (BURR) ×2 IMPLANT
CANISTER SUCTION 2500CC (MISCELLANEOUS) ×2 IMPLANT
CLOTH BEACON ORANGE TIMEOUT ST (SAFETY) ×2 IMPLANT
CONT SPEC 4OZ CLIKSEAL STRL BL (MISCELLANEOUS) ×2 IMPLANT
DECANTER SPIKE VIAL GLASS SM (MISCELLANEOUS) ×2 IMPLANT
DERMABOND ADVANCED (GAUZE/BANDAGES/DRESSINGS) ×1
DERMABOND ADVANCED .7 DNX12 (GAUZE/BANDAGES/DRESSINGS) IMPLANT
DRAIN CHANNEL 7F 3/4 FLAT (WOUND CARE) ×1 IMPLANT
DRAPE C-ARM 42X72 X-RAY (DRAPES) ×4 IMPLANT
DRAPE LAPAROTOMY 100X72 PEDS (DRAPES) ×2 IMPLANT
DRAPE MICROSCOPE ZEISS OPMI (DRAPES) ×2 IMPLANT
DRAPE POUCH INSTRU U-SHP 10X18 (DRAPES) ×2 IMPLANT
DRSG OPSITE 4X5.5 SM (GAUZE/BANDAGES/DRESSINGS) ×2 IMPLANT
DRSG OPSITE POSTOP 4X6 (GAUZE/BANDAGES/DRESSINGS) ×1 IMPLANT
DURAPREP 6ML APPLICATOR 50/CS (WOUND CARE) ×2 IMPLANT
ELECT COATED BLADE 2.86 ST (ELECTRODE) ×2 IMPLANT
ELECT REM PT RETURN 9FT ADLT (ELECTROSURGICAL) ×2
ELECTRODE REM PT RTRN 9FT ADLT (ELECTROSURGICAL) ×1 IMPLANT
EVACUATOR SILICONE 100CC (DRAIN) ×1 IMPLANT
GAUZE SPONGE 4X4 16PLY XRAY LF (GAUZE/BANDAGES/DRESSINGS) IMPLANT
GLOVE BIO SURGEON STRL SZ 6.5 (GLOVE) IMPLANT
GLOVE BIO SURGEON STRL SZ7 (GLOVE) IMPLANT
GLOVE BIO SURGEON STRL SZ7.5 (GLOVE) IMPLANT
GLOVE BIO SURGEON STRL SZ8 (GLOVE) ×2 IMPLANT
GLOVE BIO SURGEON STRL SZ8.5 (GLOVE) IMPLANT
GLOVE BIOGEL M 8.0 STRL (GLOVE) IMPLANT
GLOVE ECLIPSE 6.5 STRL STRAW (GLOVE) IMPLANT
GLOVE ECLIPSE 7.0 STRL STRAW (GLOVE) IMPLANT
GLOVE ECLIPSE 7.5 STRL STRAW (GLOVE) IMPLANT
GLOVE ECLIPSE 8.0 STRL XLNG CF (GLOVE) IMPLANT
GLOVE ECLIPSE 8.5 STRL (GLOVE) IMPLANT
GLOVE EXAM NITRILE LRG STRL (GLOVE) IMPLANT
GLOVE EXAM NITRILE MD LF STRL (GLOVE) IMPLANT
GLOVE EXAM NITRILE XL STR (GLOVE) IMPLANT
GLOVE EXAM NITRILE XS STR PU (GLOVE) IMPLANT
GLOVE INDICATOR 6.5 STRL GRN (GLOVE) IMPLANT
GLOVE INDICATOR 7.0 STRL GRN (GLOVE) ×2 IMPLANT
GLOVE INDICATOR 7.5 STRL GRN (GLOVE) IMPLANT
GLOVE INDICATOR 8.0 STRL GRN (GLOVE) IMPLANT
GLOVE INDICATOR 8.5 STRL (GLOVE) ×2 IMPLANT
GLOVE OPTIFIT SS 6.5 STRL BRWN (GLOVE) ×2 IMPLANT
GLOVE OPTIFIT SS 8.0 STRL (GLOVE) IMPLANT
GLOVE SURG SS PI 6.5 STRL IVOR (GLOVE) IMPLANT
GOWN BRE IMP SLV AUR LG STRL (GOWN DISPOSABLE) ×2 IMPLANT
GOWN BRE IMP SLV AUR XL STRL (GOWN DISPOSABLE) ×3 IMPLANT
GOWN STRL REIN 2XL LVL4 (GOWN DISPOSABLE) ×1 IMPLANT
GRAFT BNE SPCR VG2 14.5X12X7 (Bone Implant) IMPLANT
GRAFT BNE SPCR VG2 14.5X12X8 (Bone Implant) IMPLANT
HEAD HALTER (SOFTGOODS) ×2 IMPLANT
HEMOSTAT POWDER KIT SURGIFOAM (HEMOSTASIS) IMPLANT
KIT BASIN OR (CUSTOM PROCEDURE TRAY) ×2 IMPLANT
KIT ROOM TURNOVER OR (KITS) ×2 IMPLANT
NDL HYPO 18GX1.5 BLUNT FILL (NEEDLE) ×1 IMPLANT
NDL SPNL 20GX3.5 QUINCKE YW (NEEDLE) ×1 IMPLANT
NEEDLE HYPO 18GX1.5 BLUNT FILL (NEEDLE) ×2 IMPLANT
NEEDLE SPNL 20GX3.5 QUINCKE YW (NEEDLE) ×2 IMPLANT
NS IRRIG 1000ML POUR BTL (IV SOLUTION) ×2 IMPLANT
PACK LAMINECTOMY NEURO (CUSTOM PROCEDURE TRAY) ×2 IMPLANT
PAD ARMBOARD 7.5X6 YLW CONV (MISCELLANEOUS) ×6 IMPLANT
PLATE 4 75XNS SPNE CVD ANT T (Plate) IMPLANT
PLATE 4 ATLANTIS TRANS (Plate) ×2 IMPLANT
RUBBERBAND STERILE (MISCELLANEOUS) ×4 IMPLANT
SCREW 4.5X13MM (Screw) ×4 IMPLANT
SCREW ST FIX 4 ATL 3120213 (Screw) ×6 IMPLANT
SPONGE GAUZE 4X4 12PLY (GAUZE/BANDAGES/DRESSINGS) ×2 IMPLANT
SPONGE INTESTINAL PEANUT (DISPOSABLE) ×2 IMPLANT
SPONGE SURGIFOAM ABS GEL SZ50 (HEMOSTASIS) ×2 IMPLANT
STRIP CLOSURE SKIN 1/2X4 (GAUZE/BANDAGES/DRESSINGS) ×2 IMPLANT
SUT VIC AB 3-0 SH 8-18 (SUTURE) ×2 IMPLANT
SUT VICRYL 4-0 PS2 18IN ABS (SUTURE) ×2 IMPLANT
SYR 20ML ECCENTRIC (SYRINGE) ×2 IMPLANT
TAPE CLOTH 4X10 WHT NS (GAUZE/BANDAGES/DRESSINGS) IMPLANT
TOWEL OR 17X24 6PK STRL BLUE (TOWEL DISPOSABLE) ×2 IMPLANT
TOWEL OR 17X26 10 PK STRL BLUE (TOWEL DISPOSABLE) ×2 IMPLANT
TRAP SPECIMEN MUCOUS 40CC (MISCELLANEOUS) ×2 IMPLANT
WATER STERILE IRR 1000ML POUR (IV SOLUTION) ×2 IMPLANT

## 2013-03-11 NOTE — H&P (Signed)
Katie Rice is an 47 y.o. female.   Chief Complaint: Neck pain bilateral arm pain and numbness HPI: Patient is a very pleasant 47 year old female has undergone a previous C5-6 fusion that resulted in a pseudoarthrosis at that level. The patient did very well for many years however over the last several months and years she's had progressive worsening neck pain and pain no radiating to both arms with numbness and tingling in both hands and took changes in both hands with neck position. She reports in flexion she experienced numbness that goes down both pins in all of her fingers in extension she reports prominent left arm and bicep numbness and tingling pain she denies any weakness. She has been through extensive physical therapy with multiple different facilities and failed all forms of the left stimulation deep massage traction gentle therapy. She also trialed injections with no significant relief and success. She's undergone EMG it was negative for neuropathy. Workup with CT scan and plain films elderly CT myelography showed subluxations at C3-4 C4-5 and pseudarthrosis C5-6 and stenosis spondylosis at C6-7 she does have 5 foraminal stenosis of the C7 nerve roots at C6-7 and most of her difficulties at C3-4 C4-5 do show subluxation and instability on flexion-extension films at C3-4 C4-5 to due to her failure conservative treatment progression of clinical syndrome and imaging findings have recommended an anterior cervical discectomy and fusion C3-4, C4-5, C5-6, C6-7 I have extensively reviewed the risks and benefits of the operation with her as well as perioperative course expectations of outcome alternatives surgery she understands and agrees to proceed forward.  Past Medical History  Diagnosis Date  . SVD (spontaneous vaginal delivery)     x 3  . Missed abortions     x 3 MAB 2 no surgery , 1 d & C  . Interstitial cystitis     Past Surgical History  Procedure Laterality Date  . Tubal ligation     . Dilation and curettage of uterus    . Diagnostic laparoscopy    . Neck surgery      C-4 Disk removed -has plates/screws  . Colonoscopy      x 5  . Left foot surgery Left 11  . Hyperextension of bladder      x 2  . Interstem   09,13    battery in left hip for IC, replaced battery  . Abdominal hysterectomy    . Ovarian cyst removal Left 05    Family History  Problem Relation Age of Onset  . CAD Father   . Hypertension Father   . Hypertension Mother   . Hypertension Brother   . Cancer - Ovarian Paternal Aunt   . Cancer - Other Maternal Aunt     Leukemia  . Emphysema Maternal Grandfather     smoked  . Emphysema Paternal Grandfather     smoked  . Rheum arthritis Mother   . Leukemia Maternal Aunt   . Ovarian cancer Paternal Aunt    Social History:  reports that she quit smoking about 22 years ago. Her smoking use included Cigarettes. She has a .75 pack-year smoking history. She has never used smokeless tobacco. She reports that she drinks about 0.6 ounces of alcohol per week. She reports that she does not use illicit drugs.  Allergies:  Allergies  Allergen Reactions  . Doxycycline Palpitations    Makes her heart race "really bad"    Medications Prior to Admission  Medication Sig Dispense Refill  . cyclobenzaprine (FLEXERIL)  10 MG tablet Take 10 mg by mouth 3 (three) times daily as needed for muscle spasms.      . diphenhydrAMINE (BENADRYL) 25 mg capsule Take 50 mg by mouth at bedtime.      Marland Kitchen HYDROcodone-acetaminophen (NORCO/VICODIN) 5-325 MG per tablet Take 1 tablet by mouth every 6 (six) hours as needed for pain.      . pentosan polysulfate (ELMIRON) 100 MG capsule Take 200 mg by mouth at bedtime.      Marland Kitchen URELLE (URELLE/URISED) 81 MG TABS Take 1 tablet by mouth every 6 (six) hours as needed (Dysuria).       Marland Kitchen amitriptyline (ELAVIL) 25 MG tablet Take 25 mg by mouth at bedtime as needed for sleep.      . fluticasone (FLONASE) 50 MCG/ACT nasal spray Place 2 sprays into the  nose daily as needed for rhinitis or allergies.         No results found for this or any previous visit (from the past 48 hour(s)). No results found.  Review of Systems  Constitutional: Negative.   HENT: Positive for neck pain.   Eyes: Negative.   Respiratory: Negative.   Cardiovascular: Negative.   Gastrointestinal: Negative.   Genitourinary: Positive for urgency and frequency.  Musculoskeletal: Positive for myalgias and joint pain.  Skin: Negative.   Neurological: Positive for tingling and tremors.    Blood pressure 120/79, pulse 95, temperature 98.2 F (36.8 C), temperature source Oral, resp. rate 20, last menstrual period 10/17/2010, SpO2 100.00%. Physical Exam  Constitutional: She is oriented to person, place, and time. She appears well-developed and well-nourished.  HENT:  Head: Normocephalic.  Eyes: Pupils are equal, round, and reactive to light.  Cardiovascular: Normal rate.   Respiratory: Effort normal.  GI: Soft.  Musculoskeletal: Normal range of motion.  Neurological: She is alert and oriented to person, place, and time. She has normal strength. GCS eye subscore is 4. GCS verbal subscore is 5. GCS motor subscore is 6.  Strength is 5 out of 5 in her deltoids, biceps, triceps, wrist flexion wrist extension and intrinsics.     Assessment/Plan For Dr. Wynetta Rice presents for an ACDF from C3-C7.  Katie Rice P 03/11/2013, 11:34 AM

## 2013-03-11 NOTE — Preoperative (Signed)
Beta Blockers   Reason not to administer Beta Blockers:Not Applicable 

## 2013-03-11 NOTE — Op Note (Signed)
Preoperative diagnosis: #1 cervical spondylosis with stenosis and radiculopathy at C3-4, C4-5, and C6-7  #2 pseudoarthrosis C5-6  Postoperative diagnosis: Same  Procedure: #1 exploration of fusion removal of hardware C5-6 and redo anterior cervical discectomy and fusion at C5-6 using an allograft wedge  #2 anterior cervical discectomies and fusion at C3-4 coronal, C4-5, and C6-7. Using an allograft wedge  #3 anterior cervical plating C3-C7 using the Atlantis translational plating system with 5-13 mm rescue screws and 5 fixed angle 13 mm screws  Surgeon: Jillyn Hidden Abdulrahim Siddiqi  Assistant: Hilda Lias  Anesthesia: Gen.  EBL: Minimal  History of present illness: Patient is a very pleasant 47 year old female has had long-standing problems with her neck showed previous anterior cervical discectomy fusion at C5-6 which ultimately, varus pseudoarthrosis C5-6 however he started developing progressive spondylitic disease cervical stenosis and a dynamic subluxations at C3-4 C4-5 and cervical stenosis with spondylosis C6-7. She was having positional headaches positional numbness in her arms and hands and failed all forms of conservative treatment physical therapy anti-inflammatories injections and medication. Due to her progression of clinical syndrome failed conservative treatment we recommended anterior cervical discectomies and fusion C3-4, C4-5, and C6-7. With exploration of fusion removal of hardware and redo fusion at C5-6. I extensively reviewed the risks and benefits of the operation with the patient as well as peri-operative Course expectations about alternatives of surgery she understood and agreed to proceed forward.  Operative procedure: Patient brought into the or was induced under general anesthesia positioned supine the neck in slight extension in 5 pounds of halter traction the right 7 neck was prepped and draped in routine sterile fashion. Preoperative x-ray localize the appropriate level so a  curvilinear incision was made just off midline to the anterior border of the sternocleidomastoid and the superficial layer of the platysma was dissected out and divided longitudinally. The avascular plane to sternomastoid and strap as was developed and the previously fashioned previous to prevertebral fascia was dissected away with Kitners. The plate was immediately identified and dissected free and the plate was removed the fusion at C5-6 was inspected and was noted not to be complete incompetent. Exposure was carried out and the longus [place] as reflected laterally at C3-4, C4-5, C5-6, and C6-7. The retractor was positioned initially over C5-6 and C6-7 the disc space at C6-7 was incised the disc space is drilled down the posterior annuluscomplex the operating microscope was draped and brought into the field and the back of illumination first working again at C6-7 the disc space was further drilled down to the PLL and posterior osteophytic complex coming off the body of C6. Aggressive abutting of the endplates was carried out with a 1 and 2 mm Kerrison punch the PLL was identified and removed in piecemeal fashion. The thecal sac was visualized there was a large spur coming off the left aspect of the C6 vertebral body extending in the left C7 neuroforamen this was all removed the C7 pedicles were identified in the C7 nerve roots were decompressed and skeletonized flush with pedicle. Both endplates progressively under been to confirm all central stenosis adequate 3. This was then packed with Gelfoam meticulous hemostasis was maintained was copiously irrigated end plates were scraped with a BA curette prepared to receive the allograft a 7 mm allograft wedges was inserted approximately 1-2 mm deep to the anterior vertebral body line. Then attention second C5-6 the pseudoarthrosis level was identified the bridging partial osteophyte was removed the disc space is drilled down and there was no  adequate incompetent bone  there there was a lot of scar tissue fluoroscopy confirmed out in the disc space and then had good visualization of the endplates where the fusion was posterior occur. This was further drilled down all away until all posterior ossified all residual of partial fused bone was removed until it scar tissue is identified anterior the dura the endplates were then scraped all good bleeding bone was obtained from both vertebral bodies then a allograft wedge was then inserted again 1-2 mm deep to the anterior vertebra aligned this was a 6 mm allograft. Then the retractor.was removed and repositioned to C3-4 and C4-5 and a similar fashion both the spaces were incised drilled down scraped a BA curette and under microscopic illumination first working at C3-4-1 large posterior spur was removed off the back of the C3 vertebral body this decompressed central canal both C4 pedicles identifiable C4 nerve roots decompressed meticulous in space was maintained after all endplates progressively under been decompress the central canal and either allograft was inserted C3-4 and attention was taken to the C4-5 and C4-5-1 similar fashion was treated there is a large spur displacing the left C5 nerve root this was also removed C5 pedicles were identified to C5 nerve roots, social pedicle all endplates progressively under been decompress the central canal all endplates were prepared by scraping with a BA curette and 2575 allograft were inserted at C4-5 at C3-4 after only a by [employer] root and a microscope was removed.Botero cyst a dural critical parts of the case the plate was then sized and placed using fluoroscopy confirmed sizing of the plate a 72 mm Atlantis translational plate was placed to the old screw screw holes at C5 and C6 were used with a rescue screws new holes were drilled elsewhere there was one screw that was was not advancing completely and loose this was removed it was re\re tapped and a right and her in a fixed angle  screws removed it was reattached in a rescue screw as placed. Posterior fluoroscopy confirmed good position of plate and implants screws a drain was placed and the cuts in space was maintained and the wounds closed in layers with after Vicryl and platysma running 4 subcuticular in the skin. At the end of case all needle counts and sponge counts were correct.

## 2013-03-11 NOTE — Anesthesia Procedure Notes (Signed)
Procedure Name: Intubation Date/Time: 03/11/2013 12:05 PM Performed by: Gwenyth Allegra Pre-anesthesia Checklist: Emergency Drugs available, Patient identified, Timeout performed, Suction available and Patient being monitored Patient Re-evaluated:Patient Re-evaluated prior to inductionOxygen Delivery Method: Circle system utilized Preoxygenation: Pre-oxygenation with 100% oxygen Intubation Type: IV induction Ventilation: Mask ventilation without difficulty and Oral airway inserted - appropriate to patient size Laryngoscope Size: Mac and 4 Grade View: Grade I Tube type: Oral Tube size: 7.0 mm Number of attempts: 1 Airway Equipment and Method: Stylet and LTA kit utilized Placement Confirmation: ETT inserted through vocal cords under direct vision,  breath sounds checked- equal and bilateral and positive ETCO2 Secured at: 22 cm Tube secured with: Tape Dental Injury: Teeth and Oropharynx as per pre-operative assessment

## 2013-03-11 NOTE — Anesthesia Postprocedure Evaluation (Signed)
Anesthesia Post Note  Patient: Katie Rice  Procedure(s) Performed: Procedure(s) (LRB): Cervical three-four, cervical four-five, cervical six-seven, redo cervical five-six anterior cervical decompression fusion with plate (N/A)  Anesthesia type: general  Patient location: PACU  Post pain: Pain level controlled  Post assessment: Patient's Cardiovascular Status Stable  Last Vitals:  Filed Vitals:   03/11/13 1554  BP: 95/80  Pulse: 103  Temp:   Resp: 16    Post vital signs: Reviewed and stable  Level of consciousness: sedated  Complications: No apparent anesthesia complications

## 2013-03-11 NOTE — Anesthesia Preprocedure Evaluation (Addendum)
Anesthesia Evaluation   Patient awake    Reviewed: Allergy & Precautions, H&P , NPO status , Patient's Chart, lab work & pertinent test results  History of Anesthesia Complications Negative for: history of anesthetic complications  Airway Mallampati: II TM Distance: >3 FB Neck ROM: Full    Dental  (+) Teeth Intact, Caps and Dental Advisory Given   Pulmonary neg pulmonary ROS, former smoker,    Pulmonary exam normal       Cardiovascular negative cardio ROS      Neuro/Psych negative psych ROS   GI/Hepatic negative GI ROS, Neg liver ROS,   Endo/Other  negative endocrine ROS  Renal/GU negative Renal ROS     Musculoskeletal   Abdominal   Peds  Hematology   Anesthesia Other Findings   Reproductive/Obstetrics                          Anesthesia Physical Anesthesia Plan  ASA: II  Anesthesia Plan: General   Post-op Pain Management:    Induction: Intravenous  Airway Management Planned: Oral ETT  Additional Equipment:   Intra-op Plan:   Post-operative Plan: Extubation in OR  Informed Consent: I have reviewed the patients History and Physical, chart, labs and discussed the procedure including the risks, benefits and alternatives for the proposed anesthesia with the patient or authorized representative who has indicated his/her understanding and acceptance.   Dental advisory given  Plan Discussed with: CRNA, Anesthesiologist and Surgeon  Anesthesia Plan Comments:        Anesthesia Quick Evaluation

## 2013-03-11 NOTE — Transfer of Care (Signed)
Immediate Anesthesia Transfer of Care Note  Patient: Katie Rice  Procedure(s) Performed: Procedure(s): Cervical three-four, cervical four-five, cervical six-seven, redo cervical five-six anterior cervical decompression fusion with plate (N/A)  Patient Location: PACU  Anesthesia Type:General  Level of Consciousness: awake, alert  and oriented  Airway & Oxygen Therapy: Patient Spontanous Breathing and Patient connected to nasal cannula oxygen  Post-op Assessment: Report given to PACU RN and Post -op Vital signs reviewed and stable  Post vital signs: Reviewed and stable  Complications: No apparent anesthesia complications

## 2013-03-12 ENCOUNTER — Encounter (HOSPITAL_COMMUNITY): Payer: Self-pay | Admitting: Neurosurgery

## 2013-03-12 MED ORDER — CYCLOBENZAPRINE HCL 10 MG PO TABS: 10.0000 mg | ORAL_TABLET | Freq: Three times a day (TID) | ORAL | Status: DC | PRN

## 2013-03-12 MED ORDER — DIAZEPAM 5 MG PO TABS: 5.0000 mg | ORAL_TABLET | Freq: Three times a day (TID) | ORAL | Status: DC | PRN

## 2013-03-12 MED ORDER — OXYCODONE-ACETAMINOPHEN 5-325 MG PO TABS: 1.0000 | ORAL_TABLET | ORAL | Status: DC | PRN

## 2013-03-12 NOTE — Progress Notes (Signed)
Pt. Alert and oriented,follows simple instructions, denies pain. Incision area without swelling, redness or S/S of infection. Voiding adequate clear yellow urine. Moving all extremities well and vitals stable and documented. Anterior Cervical Fusion surgery notes instructions given to patient and family member for home safety and precautions. Pt. and family stated understanding of instructions given. 

## 2013-03-12 NOTE — Discharge Summary (Signed)
  Physician Discharge Summary  Patient ID: Katie Rice MRN: [id number] DOB/AGE: [birth year] 47 y.o.  Admit date: 03/11/2013 Discharge date: 03/12/2013  Admission Diagnoses:stenosis C34, C45, C67, pseudoarthrosis C56  Discharge Diagnoses: same Active Problems:   * No active hospital problems. *   Discharged Condition: good  Hospital Course: patient admitted underwne thte above mentioned procedure and did very well, went to recovery and the floor and was ambulating and voiding tolerating a regular diet and is stable for d/c  Consults: Significant Diagnostic Studies: Treatments:ACDF 34, 45, 56, 67 Discharge Exam: Blood pressure 117/63, pulse 110, temperature 98 F (36.7 C), temperature source Oral, resp. rate 16, last menstrual period 10/17/2010, SpO2 100.00%. Intact wound dry  Disposition: home     Medication List         amitriptyline 25 MG tablet  Commonly known as:  ELAVIL  Take 25 mg by mouth at bedtime as needed for sleep.     cyclobenzaprine 10 MG tablet  Commonly known as:  FLEXERIL  Take 1 tablet (10 mg total) by mouth 3 (three) times daily as needed for muscle spasms.     cyclobenzaprine 10 MG tablet  Commonly known as:  FLEXERIL  Take 10 mg by mouth 3 (three) times daily as needed for muscle spasms.     diazepam 5 MG tablet  Commonly known as:  VALIUM  Take 1 tablet (5 mg total) by mouth every 8 (eight) hours as needed for anxiety.     diphenhydrAMINE 25 mg capsule  Commonly known as:  BENADRYL  Take 50 mg by mouth at bedtime.     fluticasone 50 MCG/ACT nasal spray  Commonly known as:  FLONASE  Place 2 sprays into the nose daily as needed for rhinitis or allergies.     HYDROcodone-acetaminophen 5-325 MG per tablet  Commonly known as:  NORCO/VICODIN  Take 1 tablet by mouth every 6 (six) hours as needed for pain.     oxyCODONE-acetaminophen 5-325 MG per tablet  Commonly known as:  PERCOCET/ROXICET  Take 1-2 tablets by mouth every 4 (four) hours as  needed.     pentosan polysulfate 100 MG capsule  Commonly known as:  ELMIRON  Take 200 mg by mouth at bedtime.     URELLE 81 MG Tabs tablet  Take 1 tablet by mouth every 6 (six) hours as needed (Dysuria).           Follow-up Information   Follow up with [facility] P, MD.   Specialty:  Neurosurgery   Contact information:   [address] [place] [address] [phone]       Signed: Maguire Killmer P 03/12/2013, 8:31 AM

## 2013-03-12 NOTE — Plan of Care (Signed)
Problem: Consults Goal: Diagnosis - Spinal Surgery Outcome: Completed/Met Date Met:  03/12/13 Cervical Spine Fusion

## 2013-03-12 NOTE — Progress Notes (Signed)
Utilization review completed. Rebie Peale, RN, BSN. 

## 2013-04-08 ENCOUNTER — Telehealth: Payer: Self-pay | Admitting: *Deleted

## 2013-04-08 NOTE — Telephone Encounter (Signed)
Left message informing pt of upcoming appointment of 04/13/13 at 4:15.  Overbooked per Dr. Drue Second. Andree Coss, RN

## 2013-04-10 ENCOUNTER — Other Ambulatory Visit: Payer: Self-pay | Admitting: Neurosurgery

## 2013-04-10 ENCOUNTER — Emergency Department (HOSPITAL_BASED_OUTPATIENT_CLINIC_OR_DEPARTMENT_OTHER): Payer: Managed Care, Other (non HMO)

## 2013-04-10 ENCOUNTER — Emergency Department (HOSPITAL_BASED_OUTPATIENT_CLINIC_OR_DEPARTMENT_OTHER)
Admission: EM | Admit: 2013-04-10 | Discharge: 2013-04-10 | Disposition: A | Discharge status: Home / Self Care | Payer: Managed Care, Other (non HMO) | Attending: Emergency Medicine | Admitting: Emergency Medicine

## 2013-04-10 ENCOUNTER — Encounter (HOSPITAL_BASED_OUTPATIENT_CLINIC_OR_DEPARTMENT_OTHER): Payer: Self-pay | Admitting: Emergency Medicine

## 2013-04-10 DIAGNOSIS — R1310 Dysphagia, unspecified: Secondary | ICD-10-CM

## 2013-04-10 DIAGNOSIS — G8918 Other acute postprocedural pain: Secondary | ICD-10-CM | POA: Insufficient documentation

## 2013-04-10 DIAGNOSIS — R509 Fever, unspecified: Secondary | ICD-10-CM | POA: Insufficient documentation

## 2013-04-10 DIAGNOSIS — R070 Pain in throat: Secondary | ICD-10-CM | POA: Insufficient documentation

## 2013-04-10 DIAGNOSIS — Z87891 Personal history of nicotine dependence: Secondary | ICD-10-CM | POA: Insufficient documentation

## 2013-04-10 DIAGNOSIS — M542 Cervicalgia: Secondary | ICD-10-CM | POA: Insufficient documentation

## 2013-04-10 DIAGNOSIS — Z8742 Personal history of other diseases of the female genital tract: Secondary | ICD-10-CM | POA: Insufficient documentation

## 2013-04-10 DIAGNOSIS — Z79899 Other long term (current) drug therapy: Secondary | ICD-10-CM | POA: Insufficient documentation

## 2013-04-10 LAB — COMPREHENSIVE METABOLIC PANEL
ALT: 22 U/L (ref 0–35)
Calcium: 10.3 mg/dL (ref 8.4–10.5)
Creatinine, Ser: 0.7 mg/dL (ref 0.50–1.10)
GFR calc Af Amer: >90 mL/min (ref >90)
Glucose, Bld: 95 mg/dL (ref 70–99)
Sodium: 140 mEq/L (ref 135–145)
Total Protein: 7.3 g/dL (ref 6.0–8.3)

## 2013-04-10 LAB — CBC WITH DIFFERENTIAL/PLATELET
Basophils Absolute: 0 10*3/uL (ref 0.0–0.1)
Eosinophils Absolute: 0.1 10*3/uL (ref 0.0–0.7)
Eosinophils Relative: 1 % (ref 0–5)
HCT: 39.3 % (ref 36.0–46.0)
Hemoglobin: 13.5 g/dL (ref 12.0–15.0)
Lymphocytes Relative: 27 % (ref 12–46)
Lymphs Abs: 2.3 10*3/uL (ref 0.7–4.0)
MCHC: 34.4 g/dL (ref 30.0–36.0)
Monocytes Absolute: 0.9 10*3/uL (ref 0.1–1.0)
Neutro Abs: 5.2 10*3/uL (ref 1.7–7.7)
Neutrophils Relative %: 62 % (ref 43–77)
RBC: 4.16 MIL/uL (ref 3.87–5.11)
WBC: 8.5 10*3/uL (ref 4.0–10.5)

## 2013-04-10 MED ORDER — IOHEXOL 350 MG/ML SOLN
100.0000 mL | Freq: Once | INTRAVENOUS | Status: AC | PRN
  Administered 2013-04-10: 100 mL via INTRAVENOUS

## 2013-04-10 MED ORDER — SODIUM CHLORIDE 0.9 % IV SOLN
Freq: Once | INTRAVENOUS | Status: AC
  Administered 2013-04-10: 20:00:00 via INTRAVENOUS

## 2013-04-10 NOTE — ED Provider Notes (Signed)
Medical screening examination/treatment/procedure(s) were conducted as a shared visit with non-physician practitioner(s) and myself.  I personally evaluated the patient during the encounter  47 yo female with recent anterior approach cervical fusion with sore throat and difficulty swallowing.  Advised to come to ED by surgeon for close evaluation.  CT negative for surgical complication or airway compromise.  On my exam, well healing surgical scar, no swelling or erythema, normal voice, no stridor, no distress, eating a chicken sandwich without apparent difficulty. She will follow up closely with her surgeon.  Return precautions given.   Clinical Impression: 1. Throat pain       Candyce Churn, MD 04/10/13 2328

## 2013-04-10 NOTE — ED Notes (Signed)
MD at bedside. 

## 2013-04-10 NOTE — ED Notes (Signed)
PA at bedside.

## 2013-04-10 NOTE — ED Notes (Signed)
Pt had back surgery approximately one month ago.  Pt having soreness and swelling in throat since surgery.  Treated for thrush but continues to have swelling and feeling like FB.  No airway impairment.

## 2013-04-10 NOTE — ED Notes (Signed)
Patient transported to CT 

## 2013-04-10 NOTE — ED Notes (Signed)
No distress noted.  Pt was eating dinner (chicken sandwich and drink) without any difficulty.

## 2013-04-10 NOTE — ED Provider Notes (Signed)
Medical screening examination/treatment/procedure(s) were conducted as a shared visit with non-physician practitioner(s) and myself.  I personally evaluated the patient during the encounter.   Please see my separate note.     Candyce Churn, MD 04/10/13 2329

## 2013-04-10 NOTE — ED Provider Notes (Signed)
CSN: [id number]     Arrival date & time 04/10/13  1751 History   First MD Initiated Contact with Patient 04/10/13 1831     Chief Complaint  Patient presents with  . Oral Swelling   (Consider location/radiation/quality/duration/timing/severity/associated sxs/prior Treatment) Patient is a 47 y.o. female presenting with pharyngitis. The history is provided by the patient.  Sore Throat This is a new problem. Episode onset: 1 month. The problem occurs constantly. The problem has been gradually worsening. Associated symptoms include neck pain. Nothing aggravates the symptoms. She has tried nothing for the symptoms. The treatment provided moderate relief.  Pt had neck surgery August 13.  Fusion to neck by Dr. Wynetta Emery.  Pt was diagnosed with thrush 1 week ago.   Dr. Wynetta Emery diagnosed thrush.  He recently increased diflucan dosage.   Pt reports she has had a fever today and increasing throat tightness.    Past Medical History  Diagnosis Date  . SVD (spontaneous vaginal delivery)     x 3  . Missed abortions     x 3 MAB 2 no surgery , 1 d & C  . Interstitial cystitis    Past Surgical History  Procedure Laterality Date  . Tubal ligation    . Dilation and curettage of uterus    . Diagnostic laparoscopy    . Neck surgery      C-4 Disk removed -has plates/screws  . Colonoscopy      x 5  . Left foot surgery Left 11  . Hyperextension of bladder      x 2  . Interstem   09,13    battery in left hip for IC, replaced battery  . Abdominal hysterectomy    . Ovarian cyst removal Left 05  . Anterior cervical decompression/discectomy fusion 4 levels N/A 03/11/2013    Procedure: Cervical three-four, cervical four-five, cervical six-seven, redo cervical five-six anterior cervical decompression fusion with plate;  Surgeon: Mariam Dollar, MD;  Location: [facility];  Service: Neurosurgery;  Laterality: N/A;   Family History  Problem Relation Age of Onset  . CAD Father   . Hypertension Father   . Hypertension  Mother   . Hypertension Brother   . Cancer - Ovarian Paternal Aunt   . Cancer - Other Maternal Aunt     Leukemia  . Emphysema Maternal Grandfather     smoked  . Emphysema Paternal Grandfather     smoked  . Rheum arthritis Mother   . Leukemia Maternal Aunt   . Ovarian cancer Paternal Aunt    History  Substance Use Topics  . Smoking status: Former Smoker -- 0.25 packs/day for 3 years    Types: Cigarettes    Quit date: 07/30/1990  . Smokeless tobacco: Never Used  . Alcohol Use: 0.6 oz/week    1 Glasses of wine per week     Comment: socially   OB History   Grav Para Term Preterm Abortions TAB SAB Ect Mult Living                 Review of Systems  HENT: Positive for neck pain.   All other systems reviewed and are negative.    Allergies  Doxycycline  Home Medications   Current Outpatient Rx  Name  Route  Sig  Dispense  Refill  . fluconazole (DIFLUCAN) 200 MG tablet   Oral   Take 400 mg by mouth 2 (two) times daily.         . pentosan polysulfate (ELMIRON)  100 MG capsule   Oral   Take 200 mg by mouth at bedtime.         . cyclobenzaprine (FLEXERIL) 10 MG tablet   Oral   Take 1 tablet (10 mg total) by mouth 3 (three) times daily as needed for muscle spasms.   80 tablet   1   . diphenhydrAMINE (BENADRYL) 25 mg capsule   Oral   Take 50 mg by mouth at bedtime.         Marland Kitchen URELLE (URELLE/URISED) 81 MG TABS   Oral   Take 1 tablet by mouth every 6 (six) hours as needed (Dysuria).           BP 113/84  Pulse 103  Temp(Src) 99.4 F (37.4 C) (Oral)  Resp 20  Ht 5\' 6"  (1.676 m)  Wt 143 lb (64.864 kg)  BMI 23.09 kg/m2  SpO2 100%  LMP 10/17/2010 Physical Exam  Nursing note and vitals reviewed. Constitutional: She is oriented to person, place, and time. She appears well-developed and well-nourished.  HENT:  Head: Normocephalic.  Eyes: Conjunctivae and EOM are normal. Pupils are equal, round, and reactive to light.  Neck: Normal range of motion. Neck  supple.  Cardiovascular: Normal rate.   Pulmonary/Chest: Effort normal and breath sounds normal.  Abdominal: Soft.  Musculoskeletal: Normal range of motion.  Neurological: She is alert and oriented to person, place, and time.  Skin: Skin is warm.  Psychiatric: She has a normal mood and affect.    ED Course  Procedures (including critical care time) Labs Review Labs Reviewed  CBC WITH DIFFERENTIAL  COMPREHENSIVE METABOLIC PANEL   Imaging Review No results found.  MDM   1. Throat pain    Dr. Wynetta Emery had scheduled ct scan of pt's neck on Monday.   Ct shows no masses, no swelling.  Pt advised to finish medication.   Follow up with Dr. Wynetta Emery as scheduled   Lonia Skinner [place], PA-C 04/10/13 2131  Lonia Skinner [place], [place] 04/10/13 2150

## 2013-04-13 ENCOUNTER — Ambulatory Visit (INDEPENDENT_AMBULATORY_CARE_PROVIDER_SITE_OTHER): Payer: Managed Care, Other (non HMO) | Admitting: Internal Medicine

## 2013-04-13 ENCOUNTER — Inpatient Hospital Stay
Admission: RE | Admit: 2013-04-13 | Discharge: 2013-04-13 | Disposition: A | Discharge status: Home / Self Care | Payer: Managed Care, Other (non HMO) | Source: Ambulatory Visit | Attending: Neurosurgery | Admitting: Neurosurgery

## 2013-04-13 VITALS — BP 104/56 | HR 103 | Temp 98.5°F | Ht 67.0 in | Wt 147.0 lb

## 2013-04-13 DIAGNOSIS — R1314 Dysphagia, pharyngoesophageal phase: Secondary | ICD-10-CM

## 2013-04-13 NOTE — Progress Notes (Signed)
[facility] CLINIC NOTE  RFV: community consultation for dysphagia Subjective:    Patient ID: Katie Rice, female    DOB: 06/04/1966, 47 y.o.   MRN: [id number]  HPI Nicholaus Bloom isa 41JW F with hx of cervical stenosis who underwent anterior cervical fusion roughly 4-6 wks ago. She states that 2 wks after her surgery she started to have difficulty swallowing food. She saw dr. Wynetta Emery who found her to have thrush, she was given fluc 100mg  x 7 day no improvement, then increased 400mg  a day . She notices having difficulty breathing when bending over, had knot in her throat. Went to ED for evaluation where plain xray did not show any constriction. She reports hiaving  intermittent chills, no nightsweats. Tm 99.4. Still has difficulty swallowing food. Feels like "hung up" but no difficulty in liquids.  Current Outpatient Prescriptions on File Prior to Visit  Medication Sig Dispense Refill  . cyclobenzaprine (FLEXERIL) 10 MG tablet Take 1 tablet (10 mg total) by mouth 3 (three) times daily as needed for muscle spasms.  80 tablet  1  . diphenhydrAMINE (BENADRYL) 25 mg capsule Take 50 mg by mouth at bedtime.      . fluconazole (DIFLUCAN) 200 MG tablet Take 400 mg by mouth 2 (two) times daily.      . pentosan polysulfate (ELMIRON) 100 MG capsule Take 200 mg by mouth at bedtime.      Marland Kitchen URELLE (URELLE/URISED) 81 MG TABS Take 1 tablet by mouth every 6 (six) hours as needed (Dysuria).        No current facility-administered medications on file prior to visit.   Active Ambulatory Problems    Diagnosis Date Noted  . Atypical chest pain 08/23/2012  . SOB (shortness of breath) 08/23/2012  . Solitary pulmonary nodule 11/10/2012   Resolved Ambulatory Problems    Diagnosis Date Noted  . No Resolved Ambulatory Problems   Past Medical History  Diagnosis Date  . SVD (spontaneous vaginal delivery)   . Missed abortions   . Interstitial cystitis      History  Substance Use Topics  . Smoking status: Former Smoker --  0.25 packs/day for 3 years    Types: Cigarettes    Quit date: 07/30/1990  . Smokeless tobacco: Never Used  . Alcohol Use: 0.6 oz/week    1 Glasses of wine per week     Comment: socially   family history includes CAD in her father; Cancer - Other in her maternal aunt; Cancer - Ovarian in her paternal aunt; Emphysema in her maternal grandfather and paternal grandfather; Hypertension in her brother, father, and mother; Leukemia in her maternal aunt; Ovarian cancer in her paternal aunt; Rheum arthritis in her mother.  Review of Systems  Constitutional: Negative for fever, chills, diaphoresis, activity change, appetite change, fatigue and unexpected weight change.  HENT: Negative for congestion, sore throat, rhinorrhea, sneezing, trouble swallowing and sinus pressure.  Eyes: Negative for photophobia and visual disturbance.  Respiratory: Negative for cough, chest tightness, shortness of breath, wheezing and stridor.  Cardiovascular: Negative for chest pain, palpitations and leg swelling.  Gastrointestinal: per hpi Genitourinary: Negative for dysuria, hematuria, flank pain and difficulty urinating.  Musculoskeletal: Negative for myalgias, back pain, joint swelling, arthralgias and gait problem.  Skin: Negative for color change, pallor, rash and wound.  Neurological: Negative for dizziness, tremors, weakness and light-headedness.  Hematological: Negative for adenopathy. Does not bruise/bleed easily.  Psychiatric/Behavioral: Negative for behavioral problems, confusion, sleep disturbance, dysphoric mood, decreased concentration and agitation.  Objective:   Physical Exam BP 104/56  Pulse 103  Temp(Src) 98.5 F (36.9 C) (Oral)  Ht 5\' 7"  (1.702 m)  Wt 147 lb (66.679 kg)  BMI 23.02 kg/m2  LMP 10/17/2010 Physical Exam  Constitutional:  oriented to person, place, and time. appears well-developed and well-nourished. No distress.  HENT: + submandibular LN on right. Mouth/Throat: Oropharynx  is clear and moist. No oropharyngeal exudate. No thrush Cardiovascular: Normal rate, regular rhythm and normal heart sounds. Exam reveals no gallop and no friction rub.  No murmur heard.  Pulmonary/Chest: Effort normal and breath sounds normal. No respiratory distress.  no wheezes.  Abdominal: Soft. Bowel sounds are normal.  exhibits no distension. There is no tenderness.  Lymphadenopathy: no cervical adenopathy.  Neurological: alert and oriented to person, place, and time.  Skin: Skin is warm and dry. No rash noted. No erythema.  Psychiatric:  normal mood and affect.  behavior is normal.          Assessment & Plan:   47 yo F who recently underwent anterior cervical fusion started to notice dysphagia roughly 2 wks after her surgery. Started treatment for thrush but symptoms still persists. Dysphagia potentially could be due to nerve injury from surgery but will do further imaging and possibly refer to ENT in order to determine if there is dysfunction noted in swallowing  Thrush/candidal eso = finish fluc 400mg  daily x 14 days in 2 days since she is on 12 of 14 days.  Dysphagia = will get Barium swallow exam. Pending results do ENT/GI referral  rtc 3-4 wks

## 2013-04-14 ENCOUNTER — Other Ambulatory Visit: Payer: Self-pay | Admitting: Neurosurgery

## 2013-04-14 DIAGNOSIS — R1310 Dysphagia, unspecified: Secondary | ICD-10-CM

## 2013-04-20 ENCOUNTER — Ambulatory Visit
Admission: RE | Admit: 2013-04-20 | Discharge: 2013-04-20 | Disposition: A | Discharge status: Home / Self Care | Payer: Managed Care, Other (non HMO) | Source: Ambulatory Visit | Attending: Neurosurgery | Admitting: Neurosurgery

## 2013-04-20 DIAGNOSIS — R1310 Dysphagia, unspecified: Secondary | ICD-10-CM

## 2013-04-21 ENCOUNTER — Other Ambulatory Visit: Payer: Self-pay

## 2013-04-21 DIAGNOSIS — Z1231 Encounter for screening mammogram for malignant neoplasm of breast: Secondary | ICD-10-CM

## 2013-04-28 ENCOUNTER — Telehealth: Payer: Self-pay | Admitting: *Deleted

## 2013-04-28 DIAGNOSIS — R911 Solitary pulmonary nodule: Secondary | ICD-10-CM

## 2013-04-28 NOTE — Telephone Encounter (Signed)
Message copied by Christen Butter on Tue Apr 28, 2013  4:35 PM ------      Message from: Katie Rice      Created: Mon Nov 10, 2012  9:17 AM       Needs ct no contrast f/u nodule ------

## 2013-04-28 NOTE — Telephone Encounter (Signed)
Spoke with the pt She states that she had cxr 2 wks ago at an [facility]  She is asking if this is sufficient enough, or does she really need the CT Chest Please advise, thanks!

## 2013-04-28 NOTE — Telephone Encounter (Signed)
Ideally needs limited CT to compare apples to apples with previous CT re nodule > the cxr can't do this as accurately  If wants to wait another month fine with me

## 2013-04-28 NOTE — Telephone Encounter (Signed)
Spoke with the pt and notified of recs per MW She verbalized understanding  She is going out of town and would like to wait until Nov Order was sent to [facility] [place] for this

## 2013-05-11 ENCOUNTER — Ambulatory Visit: Payer: Managed Care, Other (non HMO) | Admitting: Internal Medicine

## 2013-06-01 ENCOUNTER — Ambulatory Visit
Admission: RE | Admit: 2013-06-01 | Discharge: 2013-06-01 | Disposition: A | Discharge status: Home / Self Care | Payer: 59 | Source: Ambulatory Visit

## 2013-06-01 DIAGNOSIS — Z1231 Encounter for screening mammogram for malignant neoplasm of breast: Secondary | ICD-10-CM

## 2013-06-03 ENCOUNTER — Encounter: Payer: Self-pay | Admitting: Internal Medicine

## 2013-06-03 ENCOUNTER — Ambulatory Visit (INDEPENDENT_AMBULATORY_CARE_PROVIDER_SITE_OTHER)
Admission: RE | Admit: 2013-06-03 | Discharge: 2013-06-03 | Disposition: A | Discharge status: Home / Self Care | Payer: Managed Care, Other (non HMO) | Source: Ambulatory Visit | Attending: Internal Medicine | Admitting: Internal Medicine

## 2013-06-03 DIAGNOSIS — R911 Solitary pulmonary nodule: Secondary | ICD-10-CM

## 2013-06-03 NOTE — Progress Notes (Signed)
Quick Note:  Spoke with pt and notified of results per Dr. Wert. Pt verbalized understanding and denied any questions.  ______ 

## 2013-06-04 ENCOUNTER — Other Ambulatory Visit: Payer: Self-pay

## 2013-07-16 DIAGNOSIS — N3010 Interstitial cystitis (chronic) without hematuria: Secondary | ICD-10-CM | POA: Insufficient documentation

## 2013-07-16 DIAGNOSIS — Z87891 Personal history of nicotine dependence: Secondary | ICD-10-CM | POA: Insufficient documentation

## 2014-02-10 DIAGNOSIS — K589 Irritable bowel syndrome without diarrhea: Secondary | ICD-10-CM | POA: Insufficient documentation

## 2014-05-13 ENCOUNTER — Other Ambulatory Visit: Payer: Self-pay

## 2014-05-13 DIAGNOSIS — Z1231 Encounter for screening mammogram for malignant neoplasm of breast: Secondary | ICD-10-CM

## 2014-05-17 DIAGNOSIS — R109 Unspecified abdominal pain: Secondary | ICD-10-CM | POA: Insufficient documentation

## 2014-05-21 ENCOUNTER — Ambulatory Visit
Admission: RE | Admit: 2014-05-21 | Discharge: 2014-05-21 | Disposition: A | Discharge status: Home / Self Care | Payer: 59 | Source: Ambulatory Visit | Attending: Gastroenterology | Admitting: Gastroenterology

## 2014-05-21 ENCOUNTER — Other Ambulatory Visit: Payer: Self-pay | Admitting: Gastroenterology

## 2014-05-21 DIAGNOSIS — R1032 Left lower quadrant pain: Secondary | ICD-10-CM

## 2014-05-21 DIAGNOSIS — R109 Unspecified abdominal pain: Secondary | ICD-10-CM

## 2014-05-21 MED ORDER — IOHEXOL 300 MG/ML  SOLN
100.0000 mL | Freq: Once | INTRAMUSCULAR | Status: AC | PRN
  Administered 2014-05-21: 100 mL via INTRAVENOUS

## 2014-06-03 ENCOUNTER — Ambulatory Visit
Admission: RE | Admit: 2014-06-03 | Discharge: 2014-06-03 | Disposition: A | Discharge status: Home / Self Care | Payer: 59 | Source: Ambulatory Visit

## 2014-06-03 DIAGNOSIS — Z1231 Encounter for screening mammogram for malignant neoplasm of breast: Secondary | ICD-10-CM

## 2014-06-07 ENCOUNTER — Ambulatory Visit (HOSPITAL_COMMUNITY)
Admission: RE | Admit: 2014-06-07 | Discharge: 2014-06-07 | Disposition: A | Discharge status: Home / Self Care | Payer: Managed Care, Other (non HMO) | Source: Ambulatory Visit | Attending: Gastroenterology | Admitting: Gastroenterology

## 2014-06-07 ENCOUNTER — Encounter (HOSPITAL_COMMUNITY)
Admission: RE | Disposition: A | Discharge status: Home / Self Care | Payer: Self-pay | Source: Ambulatory Visit | Attending: Gastroenterology

## 2014-06-07 ENCOUNTER — Other Ambulatory Visit: Payer: Self-pay | Admitting: Gastroenterology

## 2014-06-07 ENCOUNTER — Encounter (HOSPITAL_COMMUNITY): Payer: Self-pay | Admitting: *Deleted

## 2014-06-07 DIAGNOSIS — Z87891 Personal history of nicotine dependence: Secondary | ICD-10-CM | POA: Insufficient documentation

## 2014-06-07 DIAGNOSIS — M1990 Unspecified osteoarthritis, unspecified site: Secondary | ICD-10-CM | POA: Insufficient documentation

## 2014-06-07 DIAGNOSIS — R0602 Shortness of breath: Secondary | ICD-10-CM | POA: Insufficient documentation

## 2014-06-07 DIAGNOSIS — Z881 Allergy status to other antibiotic agents status: Secondary | ICD-10-CM | POA: Diagnosis not present

## 2014-06-07 DIAGNOSIS — R1012 Left upper quadrant pain: Secondary | ICD-10-CM | POA: Diagnosis present

## 2014-06-07 DIAGNOSIS — R911 Solitary pulmonary nodule: Secondary | ICD-10-CM | POA: Diagnosis not present

## 2014-06-07 DIAGNOSIS — R0789 Other chest pain: Secondary | ICD-10-CM | POA: Diagnosis not present

## 2014-06-07 DIAGNOSIS — K449 Diaphragmatic hernia without obstruction or gangrene: Secondary | ICD-10-CM | POA: Diagnosis not present

## 2014-06-07 DIAGNOSIS — K219 Gastro-esophageal reflux disease without esophagitis: Secondary | ICD-10-CM | POA: Insufficient documentation

## 2014-06-07 HISTORY — DX: Unspecified osteoarthritis, unspecified site: M19.90

## 2014-06-07 HISTORY — DX: Gastro-esophageal reflux disease without esophagitis: K21.9

## 2014-06-07 HISTORY — PX: ESOPHAGOGASTRODUODENOSCOPY: SHX5428

## 2014-06-07 SURGERY — EGD (ESOPHAGOGASTRODUODENOSCOPY)
Anesthesia: Moderate Sedation

## 2014-06-07 MED ORDER — MIDAZOLAM HCL 10 MG/2ML IJ SOLN
INTRAMUSCULAR | Status: DC | PRN
  Administered 2014-06-07 (×4): 2 mg via INTRAVENOUS

## 2014-06-07 MED ORDER — DIPHENHYDRAMINE HCL 50 MG/ML IJ SOLN
INTRAMUSCULAR | Status: AC
  Filled 2014-06-07: qty 1

## 2014-06-07 MED ORDER — BUTAMBEN-TETRACAINE-BENZOCAINE 2-2-14 % EX AERO
INHALATION_SPRAY | CUTANEOUS | Status: DC | PRN
  Administered 2014-06-07: 2 via TOPICAL

## 2014-06-07 MED ORDER — FENTANYL CITRATE 0.05 MG/ML IJ SOLN
INTRAMUSCULAR | Status: AC
  Filled 2014-06-07: qty 2

## 2014-06-07 MED ORDER — SODIUM CHLORIDE 0.9 % IV SOLN
INTRAVENOUS | Status: DC
  Administered 2014-06-07: 500 mL via INTRAVENOUS

## 2014-06-07 MED ORDER — FENTANYL CITRATE 0.05 MG/ML IJ SOLN
INTRAMUSCULAR | Status: DC | PRN
  Administered 2014-06-07 (×4): 25 ug via INTRAVENOUS

## 2014-06-07 MED ORDER — DIPHENHYDRAMINE HCL 50 MG/ML IJ SOLN
INTRAMUSCULAR | Status: DC | PRN
Start: 2014-06-07 — End: 2014-06-07
  Administered 2014-06-07: 12.5 mg via INTRAVENOUS

## 2014-06-07 MED ORDER — MIDAZOLAM HCL 10 MG/2ML IJ SOLN
INTRAMUSCULAR | Status: AC
  Filled 2014-06-07: qty 2

## 2014-06-07 NOTE — Addendum Note (Signed)
Addended by: Samuel GermanyEDWARDS, Carsyn Boster JR. on: 06/07/2014 08:48 AM   Modules accepted: Orders

## 2014-06-07 NOTE — H&P (Signed)
Subjective:   Patient is a 48 y.o. female presents with persistent right upper quadrant pain. She has severe interstitial cystitis, IBS, lactose intolerance, and gluten sensitive enteropathy. Last colonoscopy 2012 was okay she has had previous colon polyps. She had a small hiatal hernia negative celiac biopsies. She had a recent CT showing slight thickening a few loops a small bowel after an acute illness was felt to be gastroenteritis. She underwent a recent dilatation by her urologist cystoscopy and apparently everything was okay with that. Her pain has been the left side, left flank left lower now left upper quadrant. Her urine is being cultured she has empirically been given antibiotics. Urinalysis was negative white count was normal. The cause of her symptoms is unclear and for this reason EGD is to be performed with probable celiac biopsies as well.. Procedure including risks and benefits discussed in office.  Patient Active Problem List   Diagnosis Date Noted  . Solitary pulmonary nodule 11/10/2012  . Atypical chest pain 08/23/2012  . SOB (shortness of breath) 08/23/2012   Past Medical History  Diagnosis Date  . SVD (spontaneous vaginal delivery)     x 3  . Missed abortions     x 3 MAB 2 no surgery , 1 d & C  . Interstitial cystitis   . GERD (gastroesophageal reflux disease)   . Arthritis     Past Surgical History  Procedure Laterality Date  . Tubal ligation    . Dilation and curettage of uterus    . Diagnostic laparoscopy    . Neck surgery      C-4 Disk removed -has plates/screws  . Colonoscopy      x 5  . Left foot surgery Left 11  . Hyperextension of bladder      x 2  . Interstem   09,13    battery in left hip for IC, replaced battery  . Abdominal hysterectomy    . Ovarian cyst removal Left 05  . Anterior cervical decompression/discectomy fusion 4 levels N/A 03/11/2013    Procedure: Cervical three-four, cervical four-five, cervical six-seven, redo cervical five-six  anterior cervical decompression fusion with plate;  Surgeon: Mariam DollarGary P Cram, MD;  Location: [facility];  Service: Neurosurgery;  Laterality: N/A;    Prescriptions prior to admission  Medication Sig Dispense Refill Last Dose  . acetaminophen (TYLENOL) 500 MG tablet Take 1,500 mg by mouth 2 (two) times daily.   Past Week at Unknown time  . acetaminophen-codeine (TYLENOL #3) 300-30 MG per tablet Take by mouth every 4 (four) hours as needed for moderate pain.   Past Week at Unknown time  . diphenhydrAMINE (BENADRYL) 25 mg capsule Take 50 mg by mouth at bedtime.   06/06/2014 at Unknown time  . docusate sodium (COLACE) 100 MG capsule Take 100 mg by mouth 2 (two) times daily.   Past Week at Unknown time  . esomeprazole (NEXIUM) 40 MG capsule Take 40 mg by mouth daily at 12 noon.   06/06/2014 at Unknown time  . HYDROcodone-acetaminophen (NORCO/VICODIN) 5-325 MG per tablet Take 1 tablet by mouth every 6 (six) hours as needed for moderate pain.   06/07/2014 at 0730  . nitrofurantoin (MACRODANTIN) 50 MG capsule Take 50 mg by mouth 4 (four) times daily.   06/06/2014 at Unknown time  . pentosan polysulfate (ELMIRON) 100 MG capsule Take 200 mg by mouth at bedtime.   06/06/2014 at Unknown time  . tiZANidine (ZANAFLEX) 4 MG capsule Take 4 mg by mouth 3 (three)  times daily.   06/06/2014 at Unknown time  . traMADol (ULTRAM) 50 MG tablet Take by mouth every 6 (six) hours as needed.   Past Week at Unknown time  . URELLE (URELLE/URISED) 81 MG TABS Take 1 tablet by mouth every 6 (six) hours as needed (Dysuria).    Past Month at Unknown time  . cyclobenzaprine (FLEXERIL) 10 MG tablet Take 1 tablet (10 mg total) by mouth 3 (three) times daily as needed for muscle spasms. 80 tablet 1 More than a month at Unknown time  . fluconazole (DIFLUCAN) 200 MG tablet Take 400 mg by mouth 2 (two) times daily.   Taking  . nystatin (MYCOSTATIN) 100000 UNIT/ML suspension    Taking   Allergies  Allergen Reactions  . Doxycycline Palpitations     Makes her heart race "really bad"    History  Substance Use Topics  . Smoking status: Former Smoker -- 0.25 packs/day for 3 years    Types: Cigarettes    Quit date: 07/30/1990  . Smokeless tobacco: Never Used  . Alcohol Use: 0.6 oz/week    1 Glasses of wine per week     Comment: socially    Family History  Problem Relation Age of Onset  . CAD Father   . Hypertension Father   . Hypertension Mother   . Hypertension Brother   . Cancer - Ovarian Paternal Aunt   . Cancer - Other Maternal Aunt     Leukemia  . Emphysema Maternal Grandfather     smoked  . Emphysema Paternal Grandfather     smoked  . Rheum arthritis Mother   . Leukemia Maternal Aunt   . Ovarian cancer Paternal Aunt      Objective:   Patient Vitals for the past 8 hrs:  BP Temp Temp src Resp SpO2 Height Weight  06/07/14 0932 137/90 mmHg 98.1 F (36.7 C) Oral 19 99 % 5\' 6"  (1.676 m) 63.504 kg (140 lb)         See MD Preop evaluation      Assessment:   1. Continued left-sided abdominal pain of unclear etiology  Plan:   We will go ahead with EGD to evaluate the upper G.I. Track and if negative will plan on colonoscopy next week.

## 2014-06-07 NOTE — Op Note (Signed)
[facility] [address] [place], [id number]   ENDOSCOPY PROCEDURE REPORT  PATIENT: Katie Rice, Katie Rice  MR#: [id number] BIRTHDATE: 1965/09/01 , 48  yrs. old GENDER: female ENDOSCOPIST: Carman ChingJames Davian Wollenberg, MD REFERRED BY:     Dr Tracey Harriesavid Bouska PROCEDURE DATE:  06/07/2014 PROCEDURE:  EGD w/ biopsy ASA CLASS:     Class I INDICATIONS:  abdominal pain in upper left quadrant and patient has been on gluten-free diet with possibly some improvement.  She has been following this intermittently.Marland Kitchen. MEDICATIONS: Fentanyl 100 mcg IV, Versed 8 mg IV, and Benadryl 12.5 mg IV TOPICAL ANESTHETIC: Cetacaine Spray  DESCRIPTION OF PROCEDURE: After the risks benefits and alternatives of the procedure were thoroughly explained, informed consent was obtained.  The [employer] GASTOROSCOPE [id number] endoscope was introduced through the mouth and advanced to the second portion of the duodenum , Without limitations.  The instrument was slowly withdrawn as the mucosa was fully examined.      ESOPHAGUS: Small hiatal hernia.  No esophagitis or Barrett's esophagus.  STOMACH: The mucosa of the stomach appeared normal.  DUODENUM: The duodenal mucosa showed no abnormalities in the 3rd part of the duodenum.  Cold forcep biopsies were taken in the second portion.  Retroflexed views revealed no abnormalities. The scope was then withdrawn from the patient and the procedure completed.  COMPLICATIONS: There were no immediate complications.  ENDOSCOPIC IMPRESSION: 1.   Small hiatal hernia.  No esophagitis or Barrett's esophagus 2.   The mucosa of the stomach appeared normal 3.   The duodenal mucosa showed no abnormalities in the 3rd part of the duodenum cold forcep biopsies were taken in the second portion biopsies to be sent for evaluation for celiac disease 4.   No explanation for the patient's left upper quadrant pain on EGD.  RECOMMENDATIONS: We will call the patient home to arrange  colonoscopy  REPEAT EXAM:  eSigned:  Carman ChingJames Kazuki Ingle, MD 06/07/2014 11:04 AM    CC:  CPT CODES: ICD CODES:  The ICD and CPT codes recommended by this software are interpretations from the data that the clinical staff has captured with the software.  The verification of the translation of this report to the ICD and CPT codes and modifiers is the sole responsibility of the health care institution and practicing physician where this report was generated.  [employer] will not be held responsible for the validity of the ICD and CPT codes included on this report.  AMA assumes no liability for data contained or not contained herein. CPT is a Publishing rights managerregistered trademark of the [employer].  PATIENT NAME:  Katie Rice, Katie Rice MR#: [id number]

## 2014-06-07 NOTE — Discharge Instructions (Addendum)
Continue current meds. Clear liquids for 4-6 hours, if no chest pain or trouble breathing, soft foods tonight.  Regular diet tomorrow. Call for problems.  We will call to set up OP colon  Esophagogastroduodenoscopy Care After Refer to this sheet in the next few weeks. These instructions provide you with information on caring for yourself after your procedure. Your caregiver may also give you more specific instructions. Your treatment has been planned according to current medical practices, but problems sometimes occur. Call your caregiver if you have any problems or questions after your procedure.  HOME CARE INSTRUCTIONS  Do not eat or drink anything until the numbing medicine (local anesthetic) has worn off and your gag reflex has returned. You will know that the local anesthetic has worn off when you can swallow comfortably.  Do not drive for 12 hours after the procedure or as directed by your caregiver.  Only take medicines as directed by your caregiver. SEEK MEDICAL CARE IF:   You cannot stop coughing.  You are not urinating at all or less than usual. SEEK IMMEDIATE MEDICAL CARE IF:  You have difficulty swallowing.  You cannot eat or drink.  You have worsening throat or chest pain.  You have dizziness, lightheadedness, or you faint.  You have nausea or vomiting.  You have chills.  You have a fever.  You have severe abdominal pain.  You have black, tarry, or bloody stools. Document Released: 07/02/2012 Document Reviewed: 07/02/2012 [facility] Patient Information 2015 [employer], [place]. This information is not intended to replace advice given to you by your health care provider. Make sure you discuss any questions you have with your health care provider.  Conscious Sedation, Adult, Care After Refer to this sheet in the next few weeks. These instructions provide you with information on caring for yourself after your procedure. Your health care provider may also give you more  specific instructions. Your treatment has been planned according to current medical practices, but problems sometimes occur. Call your health care provider if you have any problems or questions after your procedure. WHAT TO EXPECT AFTER THE PROCEDURE  After your procedure:  You may feel sleepy, clumsy, and have poor balance for several hours.  Vomiting may occur if you eat too soon after the procedure. HOME CARE INSTRUCTIONS  Do not participate in any activities where you could become injured for at least 24 hours. Do not:  Drive.  Swim.  Ride a bicycle.  Operate heavy machinery.  Cook.  Use power tools.  Climb ladders.  Work from a high place.  Do not make important decisions or sign legal documents until you are improved.  If you vomit, drink water, juice, or soup when you can drink without vomiting. Make sure you have little or no nausea before eating solid foods.  Only take over-the-counter or prescription medicines for pain, discomfort, or fever as directed by your health care provider.  Make sure you and your family fully understand everything about the medicines given to you, including what side effects may occur.  You should not drink alcohol, take sleeping pills, or take medicines that cause drowsiness for at least 24 hours.  If you smoke, do not smoke without supervision.  If you are feeling better, you may resume normal activities 24 hours after you were sedated.  Keep all appointments with your health care provider. SEEK MEDICAL CARE IF:  Your skin is pale or bluish in color.  You continue to feel nauseous or vomit.  Your pain is  getting worse and is not helped by medicine.  You have bleeding or swelling.  You are still sleepy or feeling clumsy after 24 hours. SEEK IMMEDIATE MEDICAL CARE IF:  You develop a rash.  You have difficulty breathing.  You develop any type of allergic problem.  You have a fever. MAKE SURE YOU:  Understand these  instructions.  Will watch your condition.  Will get help right away if you are not doing well or get worse. Document Released: 05/06/2013 Document Reviewed: 05/06/2013 [facility] Patient Information 2015 [employer], [place]. This information is not intended to replace advice given to you by your health care provider. Make sure you discuss any questions you have with your health care provider.

## 2014-06-08 ENCOUNTER — Encounter (HOSPITAL_COMMUNITY): Payer: Self-pay | Admitting: Gastroenterology

## 2014-06-14 ENCOUNTER — Other Ambulatory Visit: Payer: Self-pay | Admitting: Gastroenterology

## 2014-06-14 DIAGNOSIS — R1012 Left upper quadrant pain: Secondary | ICD-10-CM

## 2014-06-15 ENCOUNTER — Ambulatory Visit
Admission: RE | Admit: 2014-06-15 | Discharge: 2014-06-15 | Disposition: A | Discharge status: Home / Self Care | Payer: 59 | Source: Ambulatory Visit | Attending: Gastroenterology | Admitting: Gastroenterology

## 2014-06-15 DIAGNOSIS — R1012 Left upper quadrant pain: Secondary | ICD-10-CM

## 2014-06-15 MED ORDER — IOHEXOL 300 MG/ML  SOLN: 100.0000 mL | Freq: Once | INTRAMUSCULAR | Status: AC | PRN

## 2014-08-06 IMAGING — CR DG CERVICAL SPINE WITH FLEX & EXTEND
8 series · 8 of 8 positions shown · non-contrast
Comparison: None.

CLINICAL DATA: Neck pain.  Prior surgery.

CERVICAL SPINE COMPLETE WITH FLEXION AND EXTENSION VIEWS

[view not recorded (1 of 8)]
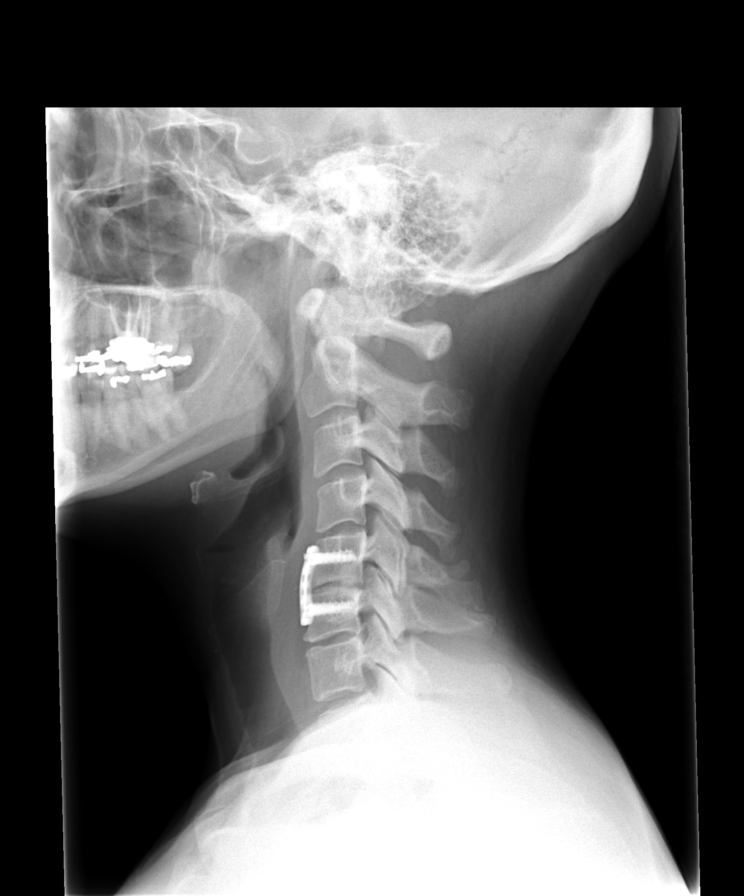

[view not recorded (2 of 8)]
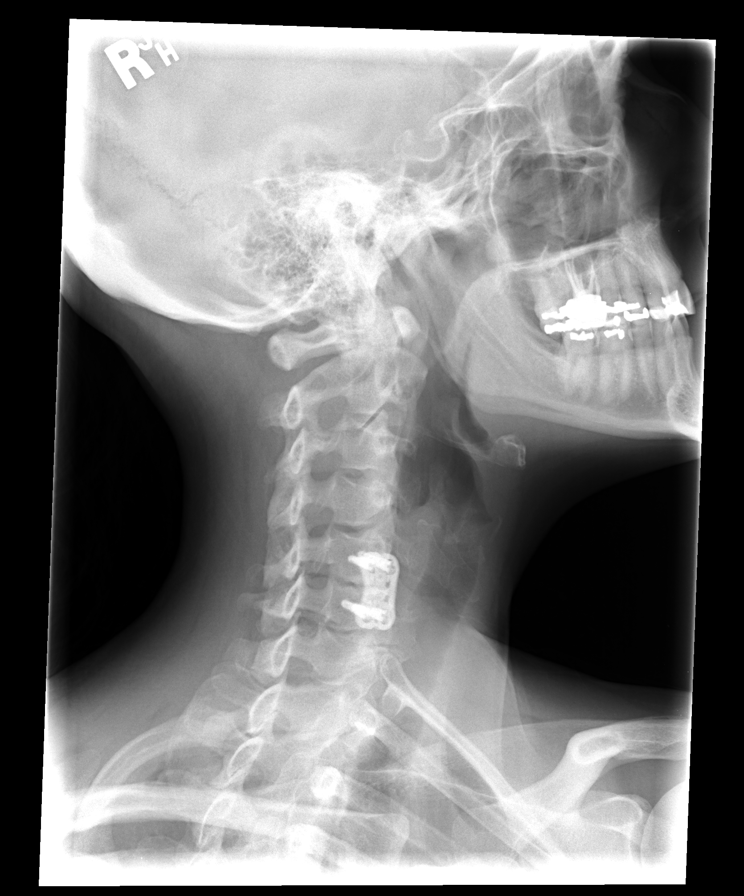

[view not recorded (3 of 8)]
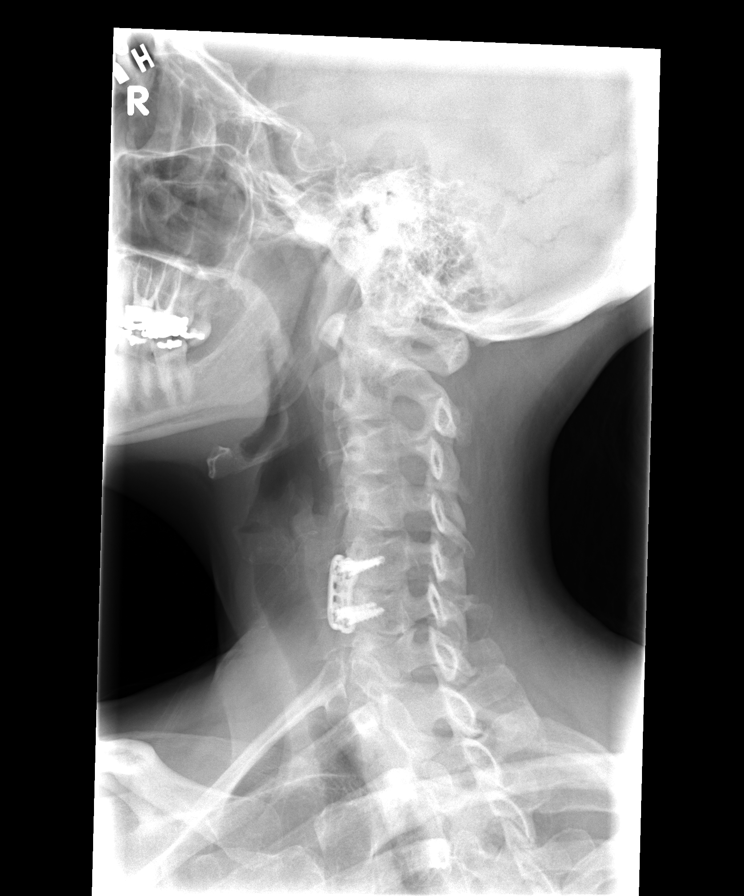

[view not recorded (4 of 8)]
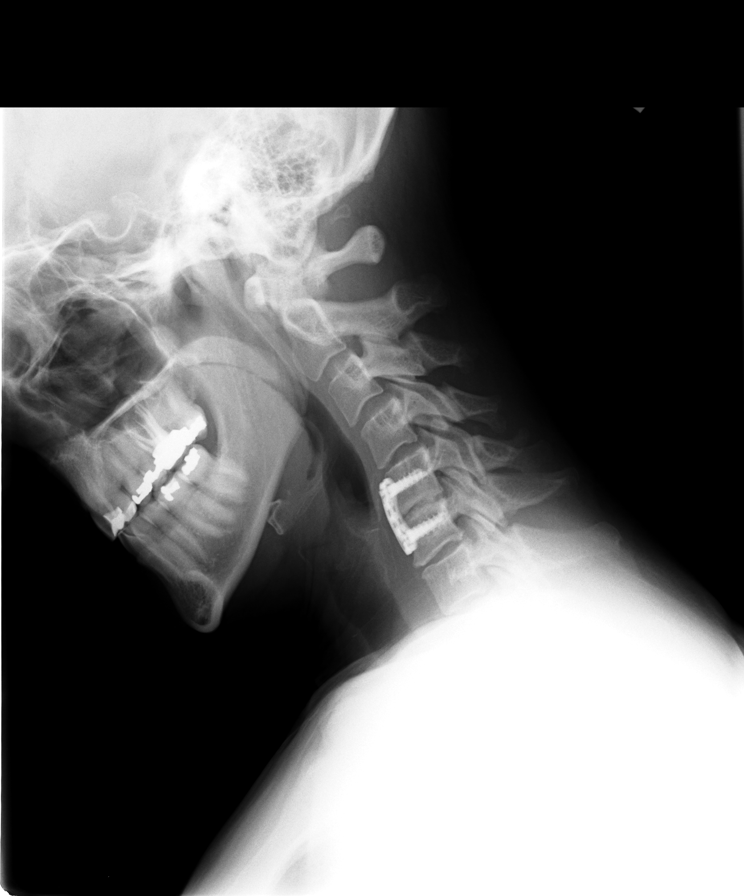

[view not recorded (5 of 8)]
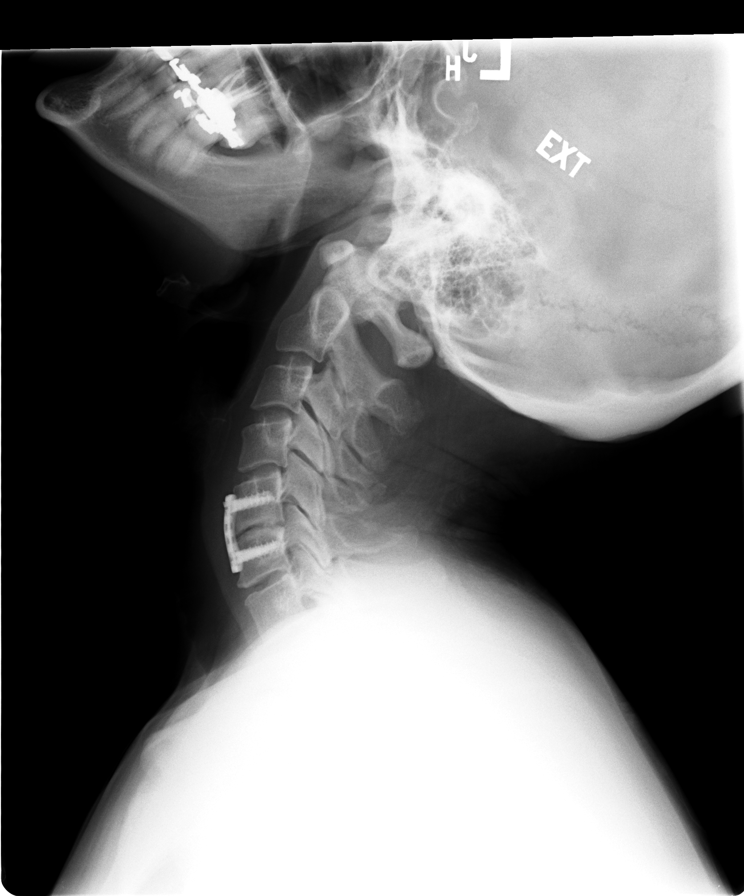

[view not recorded (6 of 8)]
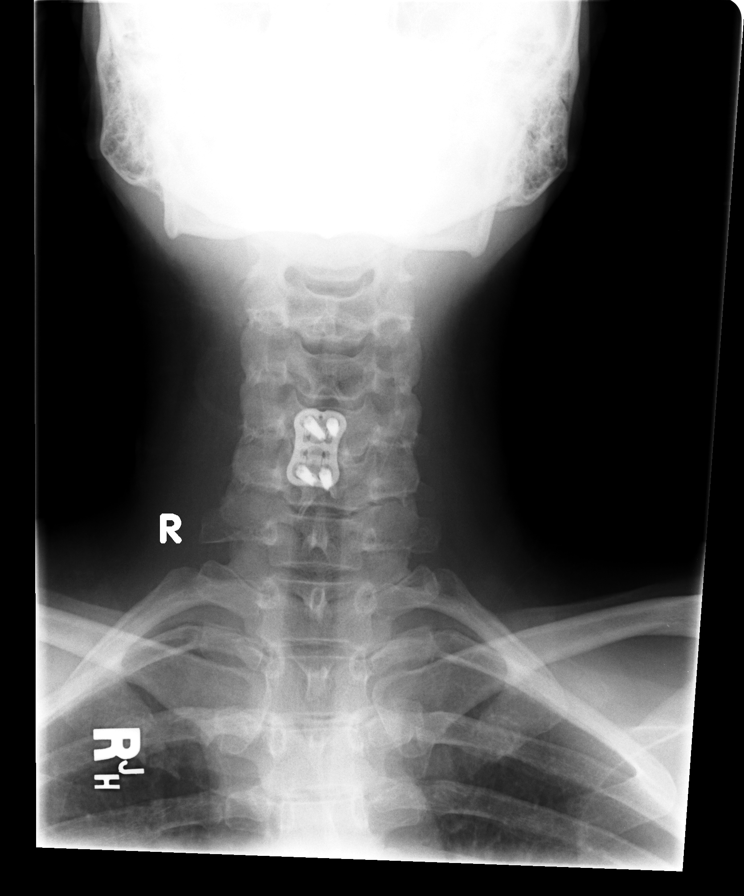

[view not recorded (7 of 8)]
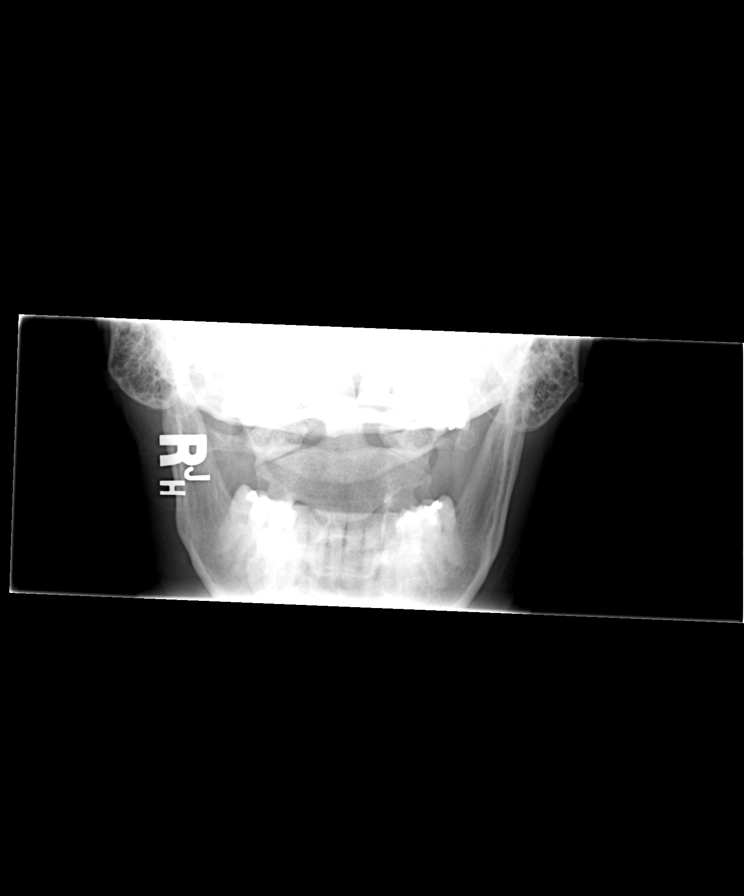

[view not recorded (8 of 8)]
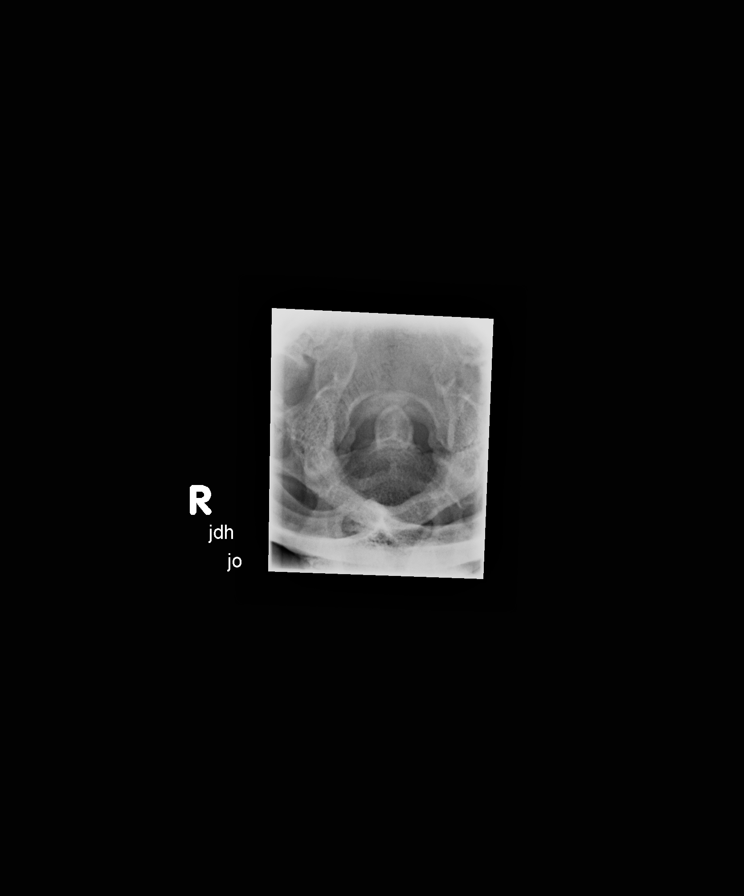

[8 of 8 positions shown; findings below may reference images not displayed]

FINDINGS: Prior anterior fusion at C5-6.  Loss of normal cervical
lordosis.  Early degenerative changes with disc space narrowing at
C4-5 and C6-7.  Mild neural foraminal narrowing on the left at C6-7
due to uncovertebral spurring.  No fracture.  Prevertebral soft
tissues are normal.  No instability with flexion or extension.
IMPRESSION: Postoperative and degenerative changes as above.  No acute
findings.

## 2014-10-18 DIAGNOSIS — F419 Anxiety disorder, unspecified: Secondary | ICD-10-CM | POA: Insufficient documentation

## 2015-04-19 ENCOUNTER — Other Ambulatory Visit: Payer: Self-pay | Admitting: Neurosurgery

## 2015-04-19 DIAGNOSIS — M5136 Other intervertebral disc degeneration, lumbar region: Secondary | ICD-10-CM

## 2015-04-19 DIAGNOSIS — M5023 Other cervical disc displacement, cervicothoracic region: Secondary | ICD-10-CM

## 2015-04-19 DIAGNOSIS — M5127 Other intervertebral disc displacement, lumbosacral region: Secondary | ICD-10-CM

## 2015-04-25 ENCOUNTER — Other Ambulatory Visit: Payer: Self-pay | Admitting: Radiology

## 2015-04-25 ENCOUNTER — Ambulatory Visit
Admission: RE | Admit: 2015-04-25 | Discharge: 2015-04-25 | Disposition: A | Discharge status: Home / Self Care | Payer: 59 | Source: Ambulatory Visit | Attending: Neurosurgery | Admitting: Neurosurgery

## 2015-04-25 ENCOUNTER — Encounter: Payer: Self-pay | Admitting: Radiology

## 2015-04-25 DIAGNOSIS — M5136 Other intervertebral disc degeneration, lumbar region: Secondary | ICD-10-CM

## 2015-04-25 DIAGNOSIS — M5127 Other intervertebral disc displacement, lumbosacral region: Secondary | ICD-10-CM

## 2015-04-25 DIAGNOSIS — M5023 Other cervical disc displacement, cervicothoracic region: Secondary | ICD-10-CM

## 2015-04-25 MED ORDER — IOHEXOL 300 MG/ML  SOLN
10.0000 mL | Freq: Once | INTRAMUSCULAR | Status: DC | PRN
  Administered 2015-04-25: 10 mL via INTRATHECAL

## 2015-04-25 MED ORDER — DIAZEPAM 5 MG PO TABS
10.0000 mg | ORAL_TABLET | Freq: Once | ORAL | Status: AC
  Administered 2015-04-25: 5 mg via ORAL

## 2015-04-25 MED ORDER — ONDANSETRON HCL 4 MG/2ML IJ SOLN
4.0000 mg | Freq: Once | INTRAMUSCULAR | Status: AC
  Administered 2015-04-25: 4 mg via INTRAMUSCULAR

## 2015-04-25 MED ORDER — HYDROMORPHONE HCL 1 MG/ML IJ SOLN
1.0000 mg | Freq: Once | INTRAMUSCULAR | Status: AC
  Administered 2015-04-25: 1 mg via INTRAMUSCULAR

## 2015-04-25 NOTE — Discharge Instructions (Signed)
Myelogram Discharge Instructions  1. Go home and rest quietly for the next 24 hours.  It is important to lie flat for the next 24 hours.  Get up only to go to the restroom.  You may lie in the bed or on a couch on your back, your stomach, your left side or your right side.  You may have one pillow under your head.  You may have pillows between your knees while you are on your side or under your knees while you are on your back.  2. DO NOT drive today.  Recline the seat as far back as it will go, while still wearing your seat belt, on the way home.  3. You may get up to go to the bathroom as needed.  You may sit up for 10 minutes to eat.  You may resume your normal diet and medications unless otherwise indicated.  Drink lots of extra fluids today and tomorrow.  4. The incidence of headache, nausea, or vomiting is about 5% (one in 20 patients).  If you develop a headache, lie flat and drink plenty of fluids until the headache goes away.  Caffeinated beverages may be helpful.  If you develop severe nausea and vomiting or a headache that does not go away with flat bed rest, call [phone].  5. You may resume normal activities after your 24 hours of bed rest is over; however, do not exert yourself strongly or do any heavy lifting tomorrow. If when you get up you have a headache when standing, go back to bed and force fluids for another 24 hours.  6. Call your physician for a follow-up appointment.  The results of your myelogram will be sent directly to your physician by the following day.  7. If you have any questions or if complications develop after you arrive home, please call [phone].  Discharge instructions have been explained to the patient.  The patient, or the person responsible for the patient, fully understands these instructions.       May resume Tramadol on Sept. 27, 2016, after 11:00 am.

## 2015-04-25 NOTE — Progress Notes (Signed)
Patient states she has been off Tramadol for at least the past two days. 

## 2015-12-13 ENCOUNTER — Encounter (HOSPITAL_BASED_OUTPATIENT_CLINIC_OR_DEPARTMENT_OTHER): Payer: Self-pay | Admitting: Emergency Medicine

## 2015-12-13 ENCOUNTER — Emergency Department (HOSPITAL_BASED_OUTPATIENT_CLINIC_OR_DEPARTMENT_OTHER): Payer: Managed Care, Other (non HMO)

## 2015-12-13 ENCOUNTER — Emergency Department (HOSPITAL_BASED_OUTPATIENT_CLINIC_OR_DEPARTMENT_OTHER)
Admission: EM | Admit: 2015-12-13 | Discharge: 2015-12-13 | Disposition: A | Discharge status: Home / Self Care | Payer: Managed Care, Other (non HMO) | Attending: Emergency Medicine | Admitting: Emergency Medicine

## 2015-12-13 DIAGNOSIS — R102 Pelvic and perineal pain: Secondary | ICD-10-CM | POA: Insufficient documentation

## 2015-12-13 DIAGNOSIS — Z79899 Other long term (current) drug therapy: Secondary | ICD-10-CM | POA: Diagnosis not present

## 2015-12-13 DIAGNOSIS — Z87891 Personal history of nicotine dependence: Secondary | ICD-10-CM | POA: Diagnosis not present

## 2015-12-13 DIAGNOSIS — R109 Unspecified abdominal pain: Secondary | ICD-10-CM | POA: Diagnosis present

## 2015-12-13 LAB — COMPREHENSIVE METABOLIC PANEL
ALT: 20 U/L (ref 14–54)
AST: 23 U/L (ref 15–41)
Albumin: 4.7 g/dL (ref 3.5–5.0)
Alkaline Phosphatase: 63 U/L (ref 38–126)
Anion gap: 9 (ref 5–15)
BUN: 12 mg/dL (ref 6–20)
CO2: 27 mmol/L (ref 22–32)
Calcium: 9.5 mg/dL (ref 8.9–10.3)
Chloride: 100 mmol/L — ABNORMAL LOW (ref 101–111)
Creatinine, Ser: 0.84 mg/dL (ref 0.44–1.00)
GFR calc Af Amer: >60 mL/min (ref >60)
GFR calc non Af Amer: >60 mL/min (ref >60)
Glucose, Bld: 127 mg/dL — ABNORMAL HIGH (ref 65–99)
Potassium: 3.9 mmol/L (ref 3.5–5.1)
Sodium: 136 mmol/L (ref 135–145)
Total Bilirubin: 0.6 mg/dL (ref 0.3–1.2)
Total Protein: 8 g/dL (ref 6.5–8.1)

## 2015-12-13 LAB — CBC WITH DIFFERENTIAL/PLATELET
Basophils Absolute: 0 10*3/uL (ref 0.0–0.1)
Basophils Relative: 0 %
Eosinophils Absolute: 0.1 10*3/uL (ref 0.0–0.7)
Eosinophils Relative: 1 %
HCT: 44.6 % (ref 36.0–46.0)
Hemoglobin: 15.2 g/dL — ABNORMAL HIGH (ref 12.0–15.0)
Lymphocytes Relative: 21 %
Lymphs Abs: 1.4 10*3/uL (ref 0.7–4.0)
MCH: 32.6 pg (ref 26.0–34.0)
MCHC: 34.1 g/dL (ref 30.0–36.0)
MCV: 95.7 fL (ref 78.0–100.0)
Monocytes Absolute: 0.5 10*3/uL (ref 0.1–1.0)
Monocytes Relative: 7 %
Neutro Abs: 4.6 10*3/uL (ref 1.7–7.7)
Neutrophils Relative %: 71 %
Platelets: 235 10*3/uL (ref 150–400)
RBC: 4.66 MIL/uL (ref 3.87–5.11)
RDW: 12.7 % (ref 11.5–15.5)
WBC: 6.6 10*3/uL (ref 4.0–10.5)

## 2015-12-13 LAB — URINALYSIS, ROUTINE W REFLEX MICROSCOPIC
Bilirubin Urine: NEGATIVE
Glucose, UA: NEGATIVE mg/dL
Hgb urine dipstick: NEGATIVE
Ketones, ur: NEGATIVE mg/dL
Leukocytes, UA: NEGATIVE
Nitrite: NEGATIVE
Protein, ur: NEGATIVE mg/dL
Specific Gravity, Urine: 1.005 (ref 1.005–1.030)
pH: 7 (ref 5.0–8.0)

## 2015-12-13 MED ORDER — MORPHINE SULFATE (PF) 4 MG/ML IV SOLN
4.0000 mg | Freq: Once | INTRAVENOUS | Status: AC
  Administered 2015-12-13: 4 mg via INTRAVENOUS
  Filled 2015-12-13: qty 1

## 2015-12-13 MED ORDER — ONDANSETRON HCL 4 MG/2ML IJ SOLN
4.0000 mg | Freq: Once | INTRAMUSCULAR | Status: AC
  Administered 2015-12-13: 4 mg via INTRAVENOUS
  Filled 2015-12-13: qty 2

## 2015-12-13 MED ORDER — IBUPROFEN 800 MG PO TABS: 800.0000 mg | ORAL_TABLET | Freq: Three times a day (TID) | ORAL | Status: DC | PRN

## 2015-12-13 MED ORDER — TRAMADOL HCL 50 MG PO TABS
50.0000 mg | ORAL_TABLET | Freq: Four times a day (QID) | ORAL | Status: AC | PRN
Start: 2015-12-13 — End: ?

## 2015-12-13 MED ORDER — IOPAMIDOL (ISOVUE-300) INJECTION 61%
100.0000 mL | Freq: Once | INTRAVENOUS | Status: AC | PRN
  Administered 2015-12-13: 100 mL via INTRAVENOUS

## 2015-12-13 MED ORDER — SODIUM CHLORIDE 0.9 % IV BOLUS (SEPSIS)
1000.0000 mL | Freq: Once | INTRAVENOUS | Status: AC
  Administered 2015-12-13: 1000 mL via INTRAVENOUS

## 2015-12-13 MED ORDER — PROMETHAZINE HCL 25 MG PO TABS: 25.0000 mg | ORAL_TABLET | Freq: Three times a day (TID) | ORAL | Status: DC | PRN

## 2015-12-13 MED FILL — IBUPROFEN 800 MG TABLET: 800 | 7 days supply | Qty: 21 | Fill #0

## 2015-12-13 MED FILL — PROMETHAZINE 25 MG TABLET: 25 | 5 days supply | Qty: 15 | Fill #0

## 2015-12-13 MED FILL — traMADol HCL 50 MG TABS: 50 | 3 days supply | Qty: 15 | Fill #0

## 2015-12-13 NOTE — ED Notes (Signed)
Removed pt iv per nurse request. Site clean cath intact,

## 2015-12-13 NOTE — ED Provider Notes (Signed)
CSN: [id number]     Arrival date & time 12/13/15  1107 History   First MD Initiated Contact with Patient 12/13/15 1112     Chief Complaint  Patient presents with  . Abdominal Pain     (Consider location/radiation/quality/duration/timing/severity/associated sxs/prior Treatment) HPI Patient presents to the emergency department with abdominal pain that started Monday afternoon.  The patient states that she has had an fairly constant pain since that time.  She states that seems to fluctuate somewhat.  The patient states nothing seems make the condition better, but palpation makes the pain worse.  Patient states that she was recently seen by her PCP and given antibiotics for strep and a sinus infection.  Patient states that she did not take any medications prior to arrival. The patient denies chest pain, shortness of breath, headache,blurred vision, neck pain, fever, cough, weakness, numbness, dizziness, anorexia, edema, vomiting, diarrhea, rash, back pain, dysuria, hematemesis, bloody stool, near syncope, or syncope. Past Medical History  Diagnosis Date  . SVD (spontaneous vaginal delivery)     x 3  . Missed abortions     x 3 MAB 2 no surgery , 1 d & C  . Interstitial cystitis   . GERD (gastroesophageal reflux disease)   . Arthritis    Past Surgical History  Procedure Laterality Date  . Tubal ligation    . Dilation and curettage of uterus    . Diagnostic laparoscopy    . Neck surgery      C-4 Disk removed -has plates/screws  . Colonoscopy      x 5  . Left foot surgery Left 11  . Hyperextension of bladder      x 2  . Interstem   09,13    battery in left hip for IC, replaced battery  . Abdominal hysterectomy    . Ovarian cyst removal Left 05  . Anterior cervical decompression/discectomy fusion 4 levels N/A 03/11/2013    Procedure: Cervical three-four, cervical four-five, cervical six-seven, redo cervical five-six anterior cervical decompression fusion with plate;  Surgeon: Mariam Dollar, MD;  Location: [facility];  Service: Neurosurgery;  Laterality: N/A;  . Esophagogastroduodenoscopy N/A 06/07/2014    Procedure: ESOPHAGOGASTRODUODENOSCOPY (EGD);  Surgeon: Vertell Novak., MD;  Location: [facility];  Service: Endoscopy;  Laterality: N/A;   Family History  Problem Relation Age of Onset  . CAD Father   . Hypertension Father   . Hypertension Mother   . Hypertension Brother   . Cancer - Ovarian Paternal Aunt   . Cancer - Other Maternal Aunt     Leukemia  . Emphysema Maternal Grandfather     smoked  . Emphysema Paternal Grandfather     smoked  . Rheum arthritis Mother   . Leukemia Maternal Aunt   . Ovarian cancer Paternal Aunt    Social History  Substance Use Topics  . Smoking status: Former Smoker -- 0.25 packs/day for 3 years    Types: Cigarettes    Quit date: 07/30/1990  . Smokeless tobacco: Never Used  . Alcohol Use: 0.6 oz/week    1 Glasses of wine per week     Comment: socially   OB History    No data available     Review of Systems All other systems negative except as documented in the HPI. All pertinent positives and negatives as reviewed in the HPI.   Allergies  Doxycycline  Home Medications   Prior to Admission medications   Medication Sig Start Date End  Date Taking? Authorizing Provider  acetaminophen-codeine (TYLENOL #3) 300-30 MG per tablet Take by mouth every 4 (four) hours as needed for moderate pain.   Yes Historical Provider, MD  ALPRAZolam (XANAX XR) 1 MG 24 hr tablet Take 1 mg by mouth. 10/18/14  Yes Historical Provider, MD  amoxicillin-clavulanate (AUGMENTIN) 125-31.25 MG/5ML suspension Take by mouth 3 (three) times daily.   Yes Historical Provider, MD  diphenhydrAMINE (BENADRYL) 25 mg capsule Take 50 mg by mouth at bedtime.   Yes Historical Provider, MD  esomeprazole (NEXIUM) 40 MG capsule Take 40 mg by mouth daily at 12 noon.   Yes Historical Provider, MD  gabapentin (NEURONTIN) 300 MG capsule Take 300 mg by mouth 3  (three) times daily.   Yes Historical Provider, MD  pentosan polysulfate (ELMIRON) 100 MG capsule Take 200 mg by mouth at bedtime.   Yes Historical Provider, MD  tiZANidine (ZANAFLEX) 4 MG capsule Take 4 mg by mouth 3 (three) times daily.   Yes Historical Provider, MD  acetaminophen (TYLENOL) 500 MG tablet Take 1,500 mg by mouth 2 (two) times daily.    Historical Provider, MD  dicyclomine (BENTYL) 20 MG tablet Take 20 mg by mouth. 01/27/15 01/27/16  Historical Provider, MD  traMADol (ULTRAM) 50 MG tablet Take by mouth every 6 (six) hours as needed.    Historical Provider, MD  URELLE (URELLE/URISED) 81 MG TABS Take 1 tablet by mouth every 6 (six) hours as needed (Dysuria).     Historical Provider, MD   BP 110/79 mmHg  Pulse 79  Temp(Src) 98.5 F (36.9 C) (Oral)  Resp 22  Wt 64.864 kg  SpO2 100%  LMP 10/17/2010 Physical Exam  Constitutional: She is oriented to person, place, and time. She appears well-developed and well-nourished. No distress.  HENT:  Head: Normocephalic and atraumatic.  Mouth/Throat: Oropharynx is clear and moist.  Eyes: Pupils are equal, round, and reactive to light.  Neck: Normal range of motion. Neck supple.  Cardiovascular: Normal rate, regular rhythm and normal heart sounds.  Exam reveals no gallop and no friction rub.   No murmur heard. Pulmonary/Chest: Effort normal and breath sounds normal. No respiratory distress. She has no wheezes.  Abdominal: Soft. Normal appearance and bowel sounds are normal. She exhibits no distension. There is tenderness. There is no rigidity, no rebound and no guarding. No hernia.    Neurological: She is alert and oriented to person, place, and time. She exhibits normal muscle tone. Coordination normal.  Skin: Skin is warm and dry. No rash noted. No erythema.  Psychiatric: She has a normal mood and affect. Her behavior is normal.  Nursing note and vitals reviewed.   ED Course  Procedures (including critical care time) Labs  Review Labs Reviewed  COMPREHENSIVE METABOLIC PANEL - Abnormal; Notable for the following:    Chloride 100 (*)    Glucose, Bld 127 (*)    All other components within normal limits  CBC WITH DIFFERENTIAL/PLATELET - Abnormal; Notable for the following:    Hemoglobin 15.2 (*)    All other components within normal limits  URINALYSIS, ROUTINE W REFLEX MICROSCOPIC (NOT AT [facility])    Imaging Review [place] Transvaginal Non-ob  12/13/2015  CLINICAL DATA:  Three-day history of right lower quadrant and pelvic pain. Chronic interstitial cystitis. EXAM: TRANSABDOMINAL AND TRANSVAGINAL ULTRASOUND OF PELVIS TECHNIQUE: Study was performed transabdominally to optimize pelvic field of view evaluation and transvaginally to optimize internal visceral architecture evaluation. COMPARISON:  None FINDINGS: Uterus Surgically absent. Right ovary Unable to visualize by  transabdominal or transvaginal technique. No right-sided pelvic mass apparent. Left ovary Surgically absent.  No left-sided pelvic mass apparent. Other findings No abnormal free fluid. No inflammatory foci identified by ultrasound. Multiple loops of peristalsing bowel seen throughout the pelvis. IMPRESSION: Uterus and left ovary absent. Right ovary could not be visualized by transabdominal or transvaginal technique. No pelvic mass or inflammatory focus is seen by ultrasound. No free pelvic fluid. Peristalsis Ing bowel noted throughout the pelvis. There is only modest postvoid residual in the urinary bladder. Electronically Signed   By: Bretta Bang III M.D.   On: 12/13/2015 14:35   US Pelvis Complete  12/13/2015  CLINICAL DATA:  Three-day history of right lower quadrant and pelvic pain. Chronic interstitial cystitis. EXAM: TRANSABDOMINAL AND TRANSVAGINAL ULTRASOUND OF PELVIS TECHNIQUE: Study was performed transabdominally to optimize pelvic field of view evaluation and transvaginally to optimize internal visceral architecture evaluation. COMPARISON:  None  FINDINGS: Uterus Surgically absent. Right ovary Unable to visualize by transabdominal or transvaginal technique. No right-sided pelvic mass apparent. Left ovary Surgically absent.  No left-sided pelvic mass apparent. Other findings No abnormal free fluid. No inflammatory foci identified by ultrasound. Multiple loops of peristalsing bowel seen throughout the pelvis. IMPRESSION: Uterus and left ovary absent. Right ovary could not be visualized by transabdominal or transvaginal technique. No pelvic mass or inflammatory focus is seen by ultrasound. No free pelvic fluid. Peristalsis Ing bowel noted throughout the pelvis. There is only modest postvoid residual in the urinary bladder. Electronically Signed   By: Bretta Bang III M.D.   On: 12/13/2015 14:35   Ct Abdomen Pelvis W Contrast  12/13/2015  CLINICAL DATA:  Right anterior and left posterior pain for several day EXAM: CT ABDOMEN AND PELVIS WITH CONTRAST TECHNIQUE: Multidetector CT imaging of the abdomen and pelvis was performed using the standard protocol following bolus administration of intravenous contrast. CONTRAST:  ISOVUE-300 IOPAMIDOL (ISOVUE-300) INJECTION 61% COMPARISON:  06/15/2014 FINDINGS: Lower chest: The lung bases are clear. No pleural or pericardial effusion noted. Hepatobiliary: No suspicious liver abnormalities identified. The gallbladder appears normal. There is no biliary dilatation. Pancreas: No inflammation or mass identified. Spleen: The spleen appears normal. Adrenals/Urinary Tract: The adrenal glands are normal. Normal appearance of the kidneys. The urinary bladder appears normal. Stomach/Bowel: The stomach appears normal. The small bowel loops have a normal course and caliber. There is no evidence for bowel obstruction. Normal appearance of the colon. Vascular/Lymphatic: Normal appearance of the abdominal aorta. No enlarged retroperitoneal or mesenteric adenopathy. No enlarged pelvic or inguinal lymph nodes. Reproductive: The  patient appears to be status post hysterectomy. No adnexal mass noted. Other: There is no ascites or focal fluid collections within the abdomen or pelvis. Musculoskeletal: Sacral nerve root stimulator is identified. The battery pack is identified within the subcutaneous soft tissues overlying the left iliac crest. IMPRESSION: 1. No acute findings identified within the abdomen or pelvis. Electronically Signed   By: Signa Kell M.D.   On: 12/13/2015 13:21   I have personally reviewed and evaluated these images and lab results as part of my medical decision-making.   EKG Interpretation None      MDM   Final diagnoses:  Pelvic pain in female   Patient has negative CT scan and pelvic ultrasound.  Patient will be treated for her symptoms and advised to follow-up with primary care Dr. told to return here as needed.  Did advise him this could be an evolving process that is yet fully declared itself.   Patient  agrees the plan and all questions were answered. I advised the patient that she should return here in 24 hours if her pain is no better or sooner for worsening symptoms.  Patient agrees to that plan and all questions were answered       Charlestine Night, PA-C 12/13/15 1542  Geoffery Lyons, MD 12/14/15 2316

## 2015-12-13 NOTE — ED Notes (Signed)
Lower back pain that progressed to rlq abdominal pain

## 2015-12-13 NOTE — Discharge Instructions (Signed)
Return here as needed.  Your CT scan and ultrasound did not show any abnormality.  However, this could be an evolving process that is causing this problem, so if there is any changes or worsening in your condition return here for further evaluation.  Follow-up with your primary care doctor. this could be some GI irritation from the recent antibiotic use or a possible kidney stone that did not show on the CT scan.  These are just possibilities, but do not represent a definitive cause for your pain

## 2015-12-13 NOTE — ED Notes (Signed)
Patient transported to Ultrasound 

## 2016-11-16 ENCOUNTER — Other Ambulatory Visit: Payer: Self-pay

## 2016-11-16 DIAGNOSIS — I700 Atherosclerosis of aorta: Secondary | ICD-10-CM

## 2017-01-02 ENCOUNTER — Encounter: Payer: Self-pay | Admitting: Vascular Surgery

## 2017-01-08 ENCOUNTER — Encounter: Payer: Self-pay | Admitting: Vascular Surgery

## 2017-01-08 ENCOUNTER — Ambulatory Visit (INDEPENDENT_AMBULATORY_CARE_PROVIDER_SITE_OTHER): Payer: Commercial Managed Care - PPO | Admitting: Vascular Surgery

## 2017-01-08 ENCOUNTER — Ambulatory Visit (HOSPITAL_COMMUNITY)
Admission: RE | Admit: 2017-01-08 | Discharge: 2017-01-08 | Disposition: A | Discharge status: Home / Self Care | Payer: Commercial Managed Care - PPO | Source: Ambulatory Visit | Attending: Vascular Surgery | Admitting: Vascular Surgery

## 2017-01-08 VITALS — BP 122/83 | HR 67 | Temp 96.7°F | Resp 16 | Ht 66.0 in | Wt 154.0 lb

## 2017-01-08 DIAGNOSIS — I700 Atherosclerosis of aorta: Secondary | ICD-10-CM | POA: Insufficient documentation

## 2017-01-08 DIAGNOSIS — R0989 Other specified symptoms and signs involving the circulatory and respiratory systems: Secondary | ICD-10-CM | POA: Insufficient documentation

## 2017-01-08 NOTE — Progress Notes (Signed)
[facility] [place]  Patient name: Katie Rice MRN: [id number] DOB: 11-09-1965 Sex: female  REASON FOR CONSULT: Aortic and iliac artery calcification  HPI: Katie Rice is a 51 y.o. female, who is seen today to discuss calcification seen on her aortic and right iliac artery on her recent CT of her spine. I have her actual images to review. Have also shown her actual films to the patient. She has no symptoms of arterial insufficiency. She does report a premature atherosclerosis in her father at age 50. She has no risk factors. She does not smoke cigarettes and does not have hypertension. She is not diabetic. She does have degenerative disc disease and underwent CT of her lumbar spine for further evaluation  Past Medical History:  Diagnosis Date  . Arthritis   . GERD (gastroesophageal reflux disease)   . Interstitial cystitis   . Missed abortions    x 3 MAB 2 no surgery , 1 d & C  . SVD (spontaneous vaginal delivery)    x 3    Family History  Problem Relation Age of Onset  . CAD Father   . Hypertension Father   . Hypertension Mother   . Hypertension Brother   . Cancer - Ovarian Paternal Aunt   . Cancer - Other Maternal Aunt        Leukemia  . Emphysema Maternal Grandfather        smoked  . Emphysema Paternal Grandfather        smoked  . Rheum arthritis Mother   . Leukemia Maternal Aunt   . Ovarian cancer Paternal Aunt     SOCIAL HISTORY: Social History   Social History  . Marital status: Married    Spouse name: N/A  . Number of children: 3  . Years of education: N/A   Occupational History  . Home Maker    Social History Main Topics  . Smoking status: Former Smoker    Packs/day: 0.25    Years: 3.00    Types: Cigarettes    Quit date: 07/30/1990  . Smokeless tobacco: Never Used  . Alcohol use 0.6 oz/week    1 Glasses of wine per week     Comment: socially  . Drug use: No  . Sexual activity: Yes   Birth control/ protection: None   Other Topics Concern  . Not on file   Social History Narrative  . No narrative on file    Allergies  Allergen Reactions  . Doxycycline Palpitations    Makes her heart race "really bad"    Current Outpatient Prescriptions  Medication Sig Dispense Refill  . ALPRAZolam (XANAX XR) 1 MG 24 hr tablet Take 1 mg by mouth.    . Black Cohosh 80 MG CAPS Take 80 mg by mouth.    . diphenhydrAMINE (BENADRYL) 25 mg capsule Take 50 mg by mouth at bedtime.    . nitrofurantoin (MACRODANTIN) 50 MG capsule Take 50 mg by mouth daily.    . pentosan polysulfate (ELMIRON) 100 MG capsule Take 200 mg by mouth at bedtime.    Marland Kitchen. tiZANidine (ZANAFLEX) 4 MG capsule Take 4 mg by mouth 3 (three) times daily.    Marland Kitchen. dicyclomine (BENTYL) 20 MG tablet Take 20 mg by mouth.    . esomeprazole (NEXIUM) 40 MG capsule Take 40 mg by mouth daily at 12 noon.    . traMADol (ULTRAM) 50 MG tablet Take 1 tablet (50 mg total) by mouth every 6 (six)  hours as needed for severe pain. (Patient not taking: Reported on 01/08/2017) 15 tablet 0  . URELLE (URELLE/URISED) 81 MG TABS Take 1 tablet by mouth every 6 (six) hours as needed (Dysuria).      No current facility-administered medications for this visit.     REVIEW OF SYSTEMS:  [X]  denotes positive finding, [ ]  denotes negative finding Cardiac  Comments:  Chest pain or chest pressure: x   Shortness of breath upon exertion:    Short of breath when lying flat:    Irregular heart rhythm:        Vascular    Pain in calf, thigh, or hip brought on by ambulation: x Neurogenic   Pain in feet at night that wakes you up from your sleep:     Blood clot in your veins:    Leg swelling:         Pulmonary    Oxygen at home:    Productive cough:     Wheezing:         Neurologic    Sudden weakness in arms or legs:     Sudden numbness in arms or legs:     Sudden onset of difficulty speaking or slurred speech:    Temporary loss of vision in one eye:       Problems with dizziness:         Gastrointestinal    Blood in stool:     Vomited blood:         Genitourinary    Burning when urinating:  x   Blood in urine:        Psychiatric    Major depression:  x       Hematologic    Bleeding problems:    Problems with blood clotting too easily:        Skin    Rashes or ulcers:        Constitutional    Fever or chills:      PHYSICAL EXAM: Vitals:   01/08/17 0915  BP: 122/83  Pulse: 67  Resp: 16  Temp: (!) 96.7 F (35.9 C)  TempSrc: Oral  SpO2: 100%  Weight: 154 lb (69.9 kg)  Height: 5\' 6"  (1.676 m)    GENERAL: The patient is a well-nourished female, in no acute distress. The vital signs are documented above. CARDIOVASCULAR: 2+ radial and 2+ dorsalis pedis pulses bilaterally PULMONARY: There is good air exchange  ABDOMEN: Soft and non-tender  MUSCULOSKELETAL: There are no major deformities or cyanosis. NEUROLOGIC: No focal weakness or paresthesias are detected. SKIN: There are no ulcers or rashes noted. PSYCHIATRIC: The patient has a normal affect.  DATA:  Noninvasive vascular studies are office revealed normal ankle arm index and normal triphasic waveforms bilaterally  I did review her CT scan images which show slight calcification in her aorta and one small area of calcification on her posterior lateral right iliac artery  MEDICAL ISSUES: Discuss these findings with patient. Explained this procedure essentially no increased risk for progressive peripheral vascular occlusive disease. She is quite concerned regarding the premature cardiovascular issues with her father. I have would not recommend any change in her current therapy. She continue her walking program and will see [place] again on an as-needed basis   Larina Earthly, MD [facility] [place] Tel [phone] Pager [phone]

## 2017-04-03 ENCOUNTER — Emergency Department (HOSPITAL_BASED_OUTPATIENT_CLINIC_OR_DEPARTMENT_OTHER): Payer: Commercial Managed Care - PPO

## 2017-04-03 ENCOUNTER — Emergency Department (HOSPITAL_BASED_OUTPATIENT_CLINIC_OR_DEPARTMENT_OTHER)
Admission: EM | Admit: 2017-04-03 | Discharge: 2017-04-03 | Disposition: A | Discharge status: Home / Self Care | Payer: Commercial Managed Care - PPO | Attending: Emergency Medicine | Admitting: Emergency Medicine

## 2017-04-03 ENCOUNTER — Encounter (HOSPITAL_BASED_OUTPATIENT_CLINIC_OR_DEPARTMENT_OTHER): Payer: Self-pay | Admitting: Emergency Medicine

## 2017-04-03 DIAGNOSIS — R1084 Generalized abdominal pain: Secondary | ICD-10-CM | POA: Diagnosis present

## 2017-04-03 DIAGNOSIS — Z87891 Personal history of nicotine dependence: Secondary | ICD-10-CM | POA: Diagnosis not present

## 2017-04-03 DIAGNOSIS — Z79899 Other long term (current) drug therapy: Secondary | ICD-10-CM | POA: Insufficient documentation

## 2017-04-03 DIAGNOSIS — R109 Unspecified abdominal pain: Secondary | ICD-10-CM

## 2017-04-03 LAB — URINALYSIS, ROUTINE W REFLEX MICROSCOPIC
BILIRUBIN URINE: NEGATIVE
Glucose, UA: NEGATIVE mg/dL
Hgb urine dipstick: NEGATIVE
Ketones, ur: NEGATIVE mg/dL
Leukocytes, UA: NEGATIVE
NITRITE: NEGATIVE
PH: 6 (ref 5.0–8.0)
Protein, ur: NEGATIVE mg/dL

## 2017-04-03 LAB — CBC WITH DIFFERENTIAL/PLATELET
BASOS PCT: 0 %
Basophils Absolute: 0 10*3/uL (ref 0.0–0.1)
EOS ABS: 0.1 10*3/uL (ref 0.0–0.7)
EOS PCT: 2 %
HCT: 38.4 % (ref 36.0–46.0)
HEMOGLOBIN: 12.9 g/dL (ref 12.0–15.0)
Lymphocytes Relative: 22 %
Lymphs Abs: 1.7 10*3/uL (ref 0.7–4.0)
MCH: 32.7 pg (ref 26.0–34.0)
MCHC: 33.6 g/dL (ref 30.0–36.0)
MCV: 97.5 fL (ref 78.0–100.0)
MONO ABS: 1.1 10*3/uL — AB (ref 0.1–1.0)
MONOS PCT: 15 %
NEUTROS PCT: 61 %
Neutro Abs: 4.6 10*3/uL (ref 1.7–7.7)
PLATELETS: 210 10*3/uL (ref 150–400)
RBC: 3.94 MIL/uL (ref 3.87–5.11)
RDW: 11.4 % — AB (ref 11.5–15.5)
WBC: 7.6 10*3/uL (ref 4.0–10.5)

## 2017-04-03 LAB — COMPREHENSIVE METABOLIC PANEL
ALK PHOS: 47 U/L (ref 38–126)
ALT: 20 U/L (ref 14–54)
AST: 20 U/L (ref 15–41)
Albumin: 3.9 g/dL (ref 3.5–5.0)
Anion gap: 7 (ref 5–15)
BUN: 11 mg/dL (ref 6–20)
CALCIUM: 9.1 mg/dL (ref 8.9–10.3)
CO2: 29 mmol/L (ref 22–32)
CREATININE: 1.19 mg/dL — AB (ref 0.44–1.00)
Chloride: 101 mmol/L (ref 101–111)
GFR calc non Af Amer: 52 mL/min — ABNORMAL LOW (ref >60)
Glucose, Bld: 98 mg/dL (ref 65–99)
Potassium: 4.1 mmol/L (ref 3.5–5.1)
SODIUM: 137 mmol/L (ref 135–145)
Total Bilirubin: 0.3 mg/dL (ref 0.3–1.2)
Total Protein: 6.8 g/dL (ref 6.5–8.1)

## 2017-04-03 LAB — LIPASE, BLOOD: LIPASE: 21 U/L (ref 11–51)

## 2017-04-03 MED ORDER — HYOSCYAMINE SULFATE 0.125 MG SL SUBL
0.1250 mg | SUBLINGUAL_TABLET | Freq: Once | SUBLINGUAL | Status: AC
  Administered 2017-04-03: 0.125 mg via SUBLINGUAL
  Filled 2017-04-03: qty 1

## 2017-04-03 MED ORDER — IOPAMIDOL (ISOVUE-300) INJECTION 61%
100.0000 mL | Freq: Once | INTRAVENOUS | Status: AC | PRN
  Administered 2017-04-03: 100 mL via INTRAVENOUS

## 2017-04-03 MED ORDER — LACTATED RINGERS IV BOLUS (SEPSIS)
1000.0000 mL | Freq: Once | INTRAVENOUS | Status: AC
  Administered 2017-04-03: 1000 mL via INTRAVENOUS

## 2017-04-03 NOTE — ED Provider Notes (Signed)
[facility] Provider Note   CSN: [id number] Arrival date & time: 04/03/17  1048     History   Chief Complaint Chief Complaint  Patient presents with  . Abdominal Cramping    HPI Katie Rice is a 51 y.o. female.  The history is provided by the patient. No language interpreter was used.  Abdominal Cramping    Katie Rice is a 51 y.o. female who presents to the Emergency Department complaining of abdominal cramping. She has a history of interstitial cystitis as well as IBS. She had a hydrodistention of her bladder performed one week ago and was doing well post procedurally. 6 days ago she developed diarrhea, greater than 10 bowel movements daily associated with left lower quadrant and left side pain. Her sided pain is described as contraction type sensation and waxes and wanes. She has had occasional vomiting. No fevers. She does have burning with urination. No prior similar symptoms. Symptoms are severe and worsening. Past Medical History:  Diagnosis Date  . Arthritis   . GERD (gastroesophageal reflux disease)   . Interstitial cystitis   . Missed abortions    x 3 MAB 2 no surgery , 1 d & C  . SVD (spontaneous vaginal delivery)    x 3    Patient Active Problem List   Diagnosis Date Noted  . Anxiety 10/18/2014  . Left sided abdominal pain 05/17/2014  . Adaptive colitis 02/10/2014  . Chronic interstitial cystitis 07/16/2013  . Personal history of tobacco use, presenting hazards to health 07/16/2013  . Solitary pulmonary nodule 11/10/2012  . Atypical chest pain 08/23/2012  . SOB (shortness of breath) 08/23/2012    Past Surgical History:  Procedure Laterality Date  . ABDOMINAL HYSTERECTOMY    . ANTERIOR CERVICAL DECOMPRESSION/DISCECTOMY FUSION 4 LEVELS N/A 03/11/2013   Procedure: Cervical three-four, cervical four-five, cervical six-seven, redo cervical five-six anterior cervical decompression fusion with plate;  Surgeon: Mariam Dollar, MD;  Location: [facility];  Service: Neurosurgery;  Laterality: N/A;  . COLONOSCOPY     x 5  . DIAGNOSTIC LAPAROSCOPY    . DILATION AND CURETTAGE OF UTERUS    . ESOPHAGOGASTRODUODENOSCOPY N/A 06/07/2014   Procedure: ESOPHAGOGASTRODUODENOSCOPY (EGD);  Surgeon: Vertell Novak., MD;  Location: [facility];  Service: Endoscopy;  Laterality: N/A;  . hyperextension of bladder     x 2  . interstem   09,13   battery in left hip for IC, replaced battery  . left foot surgery Left 11  . NECK SURGERY     C-4 Disk removed -has plates/screws  . OVARIAN CYST REMOVAL Left 05  . TUBAL LIGATION      OB History    No data available       Home Medications    Prior to Admission medications   Medication Sig Start Date End Date Taking? Authorizing Provider  oxybutynin (DITROPAN) 5 MG tablet Take 5 mg by mouth 3 (three) times daily.   Yes [provider]  ALPRAZolam (XANAX XR) 1 MG 24 hr tablet Take 1 mg by mouth. 10/18/14   [provider]  Black Cohosh 80 MG CAPS Take 80 mg by mouth.    [provider]  dicyclomine (BENTYL) 20 MG tablet Take 20 mg by mouth. 01/27/15 01/27/16  [provider]  diphenhydrAMINE (BENADRYL) 25 mg capsule Take 50 mg by mouth at bedtime.    [provider]  esomeprazole (NEXIUM) 40 MG capsule Take 40 mg by mouth daily  at 12 noon.    [provider]  nitrofurantoin (MACRODANTIN) 50 MG capsule Take 50 mg by mouth daily.    [provider]  pentosan polysulfate (ELMIRON) 100 MG capsule Take 200 mg by mouth at bedtime.    [provider]  tiZANidine (ZANAFLEX) 4 MG capsule Take 4 mg by mouth 3 (three) times daily.    [provider]  traMADol (ULTRAM) 50 MG tablet Take 1 tablet (50 mg total) by mouth every 6 (six) hours as needed for severe pain. Patient not taking: Reported on 01/08/2017 12/13/15   Lawyer, Cristal Deerhristopher, PA-C  URELLE (URELLE/URISED) 81 MG TABS Take 1 tablet by mouth every 6 (six) hours as needed  (Dysuria).     [provider]    Family History Family History  Problem Relation Age of Onset  . CAD Father   . Hypertension Father   . Hypertension Mother   . Hypertension Brother   . Cancer - Ovarian Paternal Aunt   . Cancer - Other Maternal Aunt        Leukemia  . Emphysema Maternal Grandfather        smoked  . Emphysema Paternal Grandfather        smoked  . Rheum arthritis Mother   . Leukemia Maternal Aunt   . Ovarian cancer Paternal Aunt     Social History Social History  Substance Use Topics  . Smoking status: Former Smoker    Packs/day: 0.25    Years: 3.00    Types: Cigarettes    Quit date: 07/30/1990  . Smokeless tobacco: Never Used  . Alcohol use 0.6 oz/week    1 Glasses of wine per week     Comment: socially     Allergies   Doxycycline   Review of Systems Review of Systems  All other systems reviewed and are negative.    Physical Exam Updated Vital Signs BP (!) 146/92 (BP Location: Right Arm)   Pulse 86   Temp 98.9 F (37.2 C) (Oral)   Resp 18   Ht 5\' 6"  (1.676 m)   Wt 69.9 kg (154 lb)   LMP 10/17/2010   SpO2 100%   BMI 24.86 kg/m   Physical Exam  Constitutional: She is oriented to person, place, and time. She appears well-developed and well-nourished.  HENT:  Head: Normocephalic and atraumatic.  Cardiovascular: Normal rate and regular rhythm.   No murmur heard. Pulmonary/Chest: Effort normal and breath sounds normal. No respiratory distress.  Abdominal: Soft. There is no rebound and no guarding.  Mild diffuse abdominal tenderness  Musculoskeletal: She exhibits no edema or tenderness.  Neurological: She is alert and oriented to person, place, and time.  Skin: Skin is warm and dry.  Psychiatric: She has a normal mood and affect. Her behavior is normal.  Nursing note and vitals reviewed.    ED Treatments / Results  Labs (all labs ordered are listed, but only abnormal results are displayed) Labs Reviewed  URINALYSIS,  ROUTINE W REFLEX MICROSCOPIC - Abnormal; Notable for the following:       Result Value   Specific Gravity, Urine <1.005 (*)    All other components within normal limits    EKG  EKG Interpretation None       Radiology No results found.  Procedures Procedures (including critical care time)  Medications Ordered in ED Medications - No data to display   Initial Impression / Assessment and Plan / ED Course  I have reviewed the triage vital signs  and the nursing notes.  Pertinent labs & imaging results that were available during my care of the patient were reviewed by me and considered in my medical decision making (see chart for details).     Patient with history of interstitial cystitis as well as IBS here for evaluation of abdominal pain and diarrhea following a cystoscopy 1 week ago. Her pain seems to wax and wane in its mild on ED evaluation. She has no peritoneal findings. Labs demonstrate mild elevation in her creatinine compared baselines. UA is not consistent with UTI. Discussed with patient risks and benefits of CT abdomen to further evaluate her symptoms and she requests other imaging. CT abdomen was obtained and was negative for acute causes of her symptoms. No evidence of diverticulitis, obstructing stone. She does have a small right-sided ovarian cyst that does not appear to be continuing to her symptoms. Discussed the patient's findings and studies and unclear etiology of her symptoms. Discussed obtaining stool samples. Discussed GI as well as PCP follow-up.Discussed with patient using her home medications such as bentyl for symptom control. Discussed liquid diet. Discussed heating pads for pain control. Patient requesting stronger medications for her abdominal pain. Discussed these are not appropriate for undifferentiated abdominal pain with no clear etiology.  Final Clinical Impressions(s) / ED Diagnoses   Final diagnoses:  Abdominal cramping    New Prescriptions New  Prescriptions   No medications on file     Tilden Fossa, MD 04/04/17 [phone]

## 2017-04-03 NOTE — Discharge Instructions (Signed)
The cause of your symptoms was not identified today.  Your creatinine(kidney function) was slightly elevated today at 1.12. Please have this rechecked by her family doctor and drink plenty of fluids. Your CT scan demonstrated a small cyst on the right ovary. This will need to be rechecked by your gynecologist. Please get rechecked immediately if you develop any new or concerning symptoms.

## 2017-04-03 NOTE — ED Notes (Signed)
ED Provider at bedside. 

## 2017-04-03 NOTE — ED Notes (Signed)
Pt given collection device to obtain stool sample when able

## 2017-04-03 NOTE — ED Triage Notes (Signed)
Patient states that she had surgery about 1 - 2 weeks ago for interstitial cystitis. Since last wed she is having pain and abdominal cramping with frequent loose stools.
